# Patient Record
Sex: Female | Born: 1960 | Hispanic: No | Marital: Married | Smoking: Never smoker
Health system: Southern US, Community
[De-identification: ages and names within clinical notes are randomized; demographics above are authoritative.]

## PROBLEM LIST (undated history)

## (undated) DIAGNOSIS — E559 Vitamin D deficiency, unspecified: Secondary | ICD-10-CM

## (undated) DIAGNOSIS — E7800 Pure hypercholesterolemia, unspecified: Secondary | ICD-10-CM

## (undated) DIAGNOSIS — L989 Disorder of the skin and subcutaneous tissue, unspecified: Secondary | ICD-10-CM

## (undated) DIAGNOSIS — R7303 Prediabetes: Secondary | ICD-10-CM

## (undated) DIAGNOSIS — Z9884 Bariatric surgery status: Secondary | ICD-10-CM

## (undated) DIAGNOSIS — E538 Deficiency of other specified B group vitamins: Secondary | ICD-10-CM

## (undated) HISTORY — DX: Bariatric surgery status: Z98.84

## (undated) HISTORY — DX: Vitamin D deficiency, unspecified: E55.9

## (undated) HISTORY — PX: CHOLECYSTECTOMY: SHX55

## (undated) HISTORY — DX: Deficiency of other specified B group vitamins: E53.8

## (undated) HISTORY — DX: Prediabetes: R73.03

## (undated) HISTORY — DX: Disorder of the skin and subcutaneous tissue, unspecified: L98.9

## (undated) HISTORY — DX: Pure hypercholesterolemia, unspecified: E78.00

---

## 2002-03-29 ENCOUNTER — Encounter: Payer: Self-pay | Admitting: Gastroenterology

## 2002-03-29 ENCOUNTER — Encounter: Admission: RE | Admit: 2002-03-29 | Discharge: 2002-03-29 | Payer: Self-pay | Admitting: Gastroenterology

## 2012-12-09 HISTORY — PX: BARIATRIC SURGERY: SHX1103

## 2014-10-09 LAB — HM MAMMOGRAPHY

## 2015-02-01 ENCOUNTER — Encounter: Payer: Self-pay | Admitting: Internal Medicine

## 2015-02-01 ENCOUNTER — Ambulatory Visit (INDEPENDENT_AMBULATORY_CARE_PROVIDER_SITE_OTHER): Payer: BLUE CROSS/BLUE SHIELD | Admitting: Internal Medicine

## 2015-02-01 VITALS — BP 116/78 | HR 72 | Temp 98.4°F | Resp 20 | Ht 63.5 in | Wt 170.4 lb

## 2015-02-01 DIAGNOSIS — R350 Frequency of micturition: Secondary | ICD-10-CM | POA: Diagnosis not present

## 2015-02-01 DIAGNOSIS — R5383 Other fatigue: Secondary | ICD-10-CM

## 2015-02-01 DIAGNOSIS — F4321 Adjustment disorder with depressed mood: Secondary | ICD-10-CM

## 2015-02-01 DIAGNOSIS — Z1211 Encounter for screening for malignant neoplasm of colon: Secondary | ICD-10-CM | POA: Diagnosis not present

## 2015-02-01 DIAGNOSIS — R209 Unspecified disturbances of skin sensation: Secondary | ICD-10-CM | POA: Diagnosis not present

## 2015-02-01 DIAGNOSIS — Z9884 Bariatric surgery status: Secondary | ICD-10-CM

## 2015-02-01 DIAGNOSIS — IMO0001 Reserved for inherently not codable concepts without codable children: Secondary | ICD-10-CM

## 2015-02-01 DIAGNOSIS — L812 Freckles: Secondary | ICD-10-CM

## 2015-02-01 DIAGNOSIS — M542 Cervicalgia: Secondary | ICD-10-CM

## 2015-02-01 MED ORDER — ZOSTER VACCINE LIVE 19400 UNT/0.65ML ~~LOC~~ SOLR: 0.6500 mL | Freq: Once | SUBCUTANEOUS | Status: DC

## 2015-02-01 MED ORDER — THERA VITAL M PO TABS: 1.0000 | ORAL_TABLET | Freq: Every day | ORAL | Status: AC

## 2015-02-01 NOTE — Patient Instructions (Signed)
Take multivitamin daily  Follow up with chiropracter as scheduled  Will call with lab results  Will call with referrals for GI (colonoscopy) and dermatology  RTO in 1 month for CPE. Discuss Tetanus injection at next visit
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
## 2015-02-07 ENCOUNTER — Ambulatory Visit
Admission: RE | Admit: 2015-02-07 | Discharge: 2015-02-07 | Disposition: A | Discharge status: Home / Self Care | Payer: BLUE CROSS/BLUE SHIELD | Source: Ambulatory Visit | Attending: Internal Medicine | Admitting: Internal Medicine

## 2015-02-07 DIAGNOSIS — IMO0001 Reserved for inherently not codable concepts without codable children: Secondary | ICD-10-CM

## 2015-02-07 DIAGNOSIS — R209 Unspecified disturbances of skin sensation: Secondary | ICD-10-CM

## 2015-02-07 DIAGNOSIS — M542 Cervicalgia: Secondary | ICD-10-CM

## 2015-02-24 ENCOUNTER — Ambulatory Visit (INDEPENDENT_AMBULATORY_CARE_PROVIDER_SITE_OTHER): Payer: BLUE CROSS/BLUE SHIELD | Admitting: Internal Medicine

## 2015-02-24 ENCOUNTER — Encounter: Payer: Self-pay | Admitting: Internal Medicine

## 2015-02-24 VITALS — BP 120/82 | HR 70 | Temp 97.9°F | Resp 18 | Ht 63.0 in | Wt 168.2 lb

## 2015-02-24 DIAGNOSIS — J301 Allergic rhinitis due to pollen: Secondary | ICD-10-CM | POA: Diagnosis not present

## 2015-02-24 DIAGNOSIS — J209 Acute bronchitis, unspecified: Secondary | ICD-10-CM | POA: Diagnosis not present

## 2015-02-24 DIAGNOSIS — J0100 Acute maxillary sinusitis, unspecified: Secondary | ICD-10-CM

## 2015-02-24 MED ORDER — IPRATROPIUM-ALBUTEROL 0.5-2.5 (3) MG/3ML IN SOLN
3.0000 mL | Freq: Once | RESPIRATORY_TRACT | Status: AC
  Administered 2015-02-24: 3 mL via RESPIRATORY_TRACT

## 2015-02-24 MED ORDER — ALBUTEROL SULFATE HFA 108 (90 BASE) MCG/ACT IN AERS: 2.0000 | INHALATION_SPRAY | RESPIRATORY_TRACT | Status: DC | PRN

## 2015-02-24 MED ORDER — METHYLPREDNISOLONE (PAK) 4 MG PO TABS: ORAL_TABLET | ORAL | Status: DC

## 2015-02-24 MED ORDER — DOXYCYCLINE HYCLATE 100 MG PO TABS: 100.0000 mg | ORAL_TABLET | Freq: Two times a day (BID) | ORAL | Status: DC

## 2015-02-24 NOTE — Patient Instructions (Signed)
Push fluids and rest  Recommend OTC plain claritin, allegra or zyrtec daily for seasonal allergy  Use OTC saline nasal spray as needed to keep nose moist  Follow up as scheduled. Return to office if no better

## 2015-02-24 NOTE — Progress Notes (Signed)
Patient ID: Marie Swanson, female   DOB: May 14, 1961, 54 y.o.   MRN: [id number]    Facility  [facility]    Place of Service:   OFFICE   No Known Allergies  Chief Complaint  Patient presents with  . Acute Visit    Patient c/o cold systems: Cough, bilateral ear pain (right is worse),and headache.    HPI:  54 yo female seen today for URI sx's x several days. She c/o chest tightness, cough with production, fever at night (Tm 101F), HA, sore throat, fatigue, reduced appetite, ear itching. No Cp, SOB, dizziness, sleep disturbance, sinus pressure. Tried benadryl and Tylenol PM. No sick contacts. She is a nonsmoker.  History reviewed. No pertinent past medical history. Past Surgical History  Procedure Laterality Date  . Cholecystectomy    . Bariatric surgery  2014   History   Social History  . Marital Status: Married    Spouse Name: N/A  . Number of Children: N/A  . Years of Education: N/A   Social History Main Topics  . Smoking status: Never Smoker   . Smokeless tobacco: Not on file  . Alcohol Use: No  . Drug Use: No  . Sexual Activity: Not on file   Other Topics Concern  . None   Social History Narrative   Diet- No   Caffeine- No   Married- Yes, 1979   House- Yes, 2 story with 3 people   Pets- No   Current/Past professionBrewing technologist- Hospit[employer]   Exercise- No   Living Will- Yes   DNR- No   POA/HPOA-No           Medications: Patient's Medications  New Prescriptions   No medications on file  Previous Medications   MULTIPLE VITAMINS-MINERALS (MULTIVITAMIN) TABLET    Take 1 tablet by mouth daily.   ZOSTER VACCINE LIVE, PF, (ZOSTAVAX) [id number] UNT/0.65ML INJECTION    Inject 19,400 Units into the skin once.  Modified Medications   No medications on file  Discontinued Medications   No medications on file     Review of Systems  Constitutional: Positive for fever, appetite change and fatigue.  HENT: Positive for ear pain (and itching) and sore throat. Negative for facial  swelling and sinus pressure.   Respiratory: Positive for cough and chest tightness. Negative for shortness of breath.   Cardiovascular: Negative for chest pain.  Gastrointestinal: Negative for nausea.  Neurological: Positive for headaches.  Psychiatric/Behavioral: Negative for sleep disturbance.    Filed Vitals:   02/24/15 0936  BP: 120/82  Pulse: 70  Temp: 97.9 F (36.6 C)  TempSrc: Oral  Resp: 18  Height: 5\' 3"  (1.6 m)  Weight: 168 lb 3.2 oz (76.295 kg)  SpO2: 99%   Body mass index is 29.8 kg/(m^2).  Physical Exam  Constitutional: She appears well-developed and well-nourished.  Looks ill in NAD. Awake and alert  HENT:  Right Ear: Hearing, tympanic membrane, external ear and ear canal normal.  Left Ear: Hearing, tympanic membrane, external ear and ear canal normal.  Nose: Mucosal edema present. No rhinorrhea, sinus tenderness, nasal deformity or septal deviation. No epistaxis.  No foreign bodies. Right sinus exhibits no maxillary sinus tenderness and no frontal sinus tenderness. Left sinus exhibits maxillary sinus tenderness and frontal sinus tenderness.  Mouth/Throat: Uvula is midline and mucous membranes are normal. No oral lesions. Posterior oropharyngeal erythema present. No oropharyngeal exudate.    Eyes: Pupils are equal, round, and reactive to light. No scleral icterus.  Neck: Neck supple. No tracheal  deviation present.  Cardiovascular: Normal rate, regular rhythm, normal heart sounds and intact distal pulses.  Exam reveals no gallop and no friction rub.   No murmur heard. No LE edema b/l. no calf TTP.   Pulmonary/Chest: Effort normal. No stridor. No respiratory distress. She has wheezes (end expiratory). She has no rales.  Lymphadenopathy:    She has cervical adenopathy.  Skin: Skin is warm and dry. No rash noted.  Psychiatric: She has a normal mood and affect. Her behavior is normal. Judgment and thought content normal.     Labs reviewed: No results found for  any previous visit.   Assessment/Plan   ICD-9-CM ICD-10-CM   1. Acute bronchitis, unspecified organism probably related to #2 and #3 466.0 J20.9 methylPREDNIsolone (MEDROL DOSPACK) 4 MG tablet     albuterol (PROVENTIL HFA;VENTOLIN HFA) 108 (90 BASE) MCG/ACT inhaler     ipratropium-albuterol (DUONEB) 0.5-2.5 (3) MG/3ML nebulizer solution 3 mL  2. Allergic rhinitis due to pollen 477.0 J30.1 methylPREDNIsolone (MEDROL DOSPACK) 4 MG tablet  3. Acute maxillary sinusitis, recurrence not specified 461.0 J01.00 doxycycline (VIBRA-TABS) 100 MG tablet   --Push fluids and rest  --Recommend OTC plain claritin, allegra or zyrtec daily for seasonal allergy  --Use OTC saline nasal spray as needed to keep nose moist  --Follow up as scheduled. Return to office if no better   [facility] S. Ancil Linsey  [facility] [address] [place], [place] [address] [phone] Office (Wednesdays and Fridays 8 AM - 5 PM) [phone] Cell (Monday-Friday 8 AM - 5 PM)

## 2015-02-27 ENCOUNTER — Other Ambulatory Visit: Payer: BLUE CROSS/BLUE SHIELD

## 2015-02-27 DIAGNOSIS — Z9884 Bariatric surgery status: Secondary | ICD-10-CM

## 2015-02-27 DIAGNOSIS — R5383 Other fatigue: Secondary | ICD-10-CM

## 2015-02-27 DIAGNOSIS — F4321 Adjustment disorder with depressed mood: Secondary | ICD-10-CM

## 2015-02-28 LAB — URINALYSIS, ROUTINE W REFLEX MICROSCOPIC
Bilirubin, UA: NEGATIVE
Glucose, UA: NEGATIVE
Ketones, UA: NEGATIVE
Leukocytes, UA: NEGATIVE
Nitrite, UA: NEGATIVE
Protein, UA: NEGATIVE
RBC, UA: NEGATIVE
Specific Gravity, UA: 1.027 (ref 1.005–1.030)
UUROB: 0.2 mg/dL (ref 0.2–1.0)
pH, UA: 5.5 (ref 5.0–7.5)

## 2015-02-28 LAB — CBC WITH DIFFERENTIAL/PLATELET
BASOS: 1 %
Basophils Absolute: 0 10*3/uL (ref 0.0–0.2)
Eos: 0 %
Eosinophils Absolute: 0 10*3/uL (ref 0.0–0.4)
HEMATOCRIT: 37.5 % (ref 34.0–46.6)
HEMOGLOBIN: 12.4 g/dL (ref 11.1–15.9)
IMMATURE GRANS (ABS): 0 10*3/uL (ref 0.0–0.1)
Immature Granulocytes: 0 %
Lymphocytes Absolute: 1.8 10*3/uL (ref 0.7–3.1)
Lymphs: 32 %
MCH: 30 pg (ref 26.6–33.0)
MCHC: 33.1 g/dL (ref 31.5–35.7)
MCV: 91 fL (ref 79–97)
MONOS ABS: 0.4 10*3/uL (ref 0.1–0.9)
Monocytes: 6 %
NEUTROS ABS: 3.5 10*3/uL (ref 1.4–7.0)
Neutrophils Relative %: 61 %
Platelets: 261 10*3/uL (ref 150–379)
RBC: 4.14 x10E6/uL (ref 3.77–5.28)
RDW: 13.7 % (ref 12.3–15.4)
WBC: 5.6 10*3/uL (ref 3.4–10.8)

## 2015-02-28 LAB — COMPREHENSIVE METABOLIC PANEL
ALK PHOS: 85 IU/L (ref 39–117)
ALT: 17 IU/L (ref 0–32)
AST: 18 IU/L (ref 0–40)
Albumin/Globulin Ratio: 1.5 (ref 1.1–2.5)
Albumin: 3.9 g/dL (ref 3.5–5.5)
BILIRUBIN TOTAL: 0.7 mg/dL (ref 0.0–1.2)
BUN / CREAT RATIO: 25 — AB (ref 9–23)
BUN: 18 mg/dL (ref 6–24)
CALCIUM: 9.4 mg/dL (ref 8.7–10.2)
CHLORIDE: 103 mmol/L (ref 97–108)
CO2: 22 mmol/L (ref 18–29)
Creatinine, Ser: 0.73 mg/dL (ref 0.57–1.00)
GFR calc non Af Amer: 93 mL/min/{1.73_m2} (ref >59)
GFR, EST AFRICAN AMERICAN: 107 mL/min/{1.73_m2} (ref >59)
Globulin, Total: 2.6 g/dL (ref 1.5–4.5)
Glucose: 97 mg/dL (ref 65–99)
Potassium: 4.4 mmol/L (ref 3.5–5.2)
Sodium: 140 mmol/L (ref 134–144)
Total Protein: 6.5 g/dL (ref 6.0–8.5)

## 2015-02-28 LAB — TSH: TSH: 2.7 u[IU]/mL (ref 0.450–4.500)

## 2015-02-28 LAB — LIPID PANEL
CHOL/HDL RATIO: 3.6 ratio (ref 0.0–4.4)
Cholesterol, Total: 202 mg/dL — ABNORMAL HIGH (ref 100–199)
HDL: 56 mg/dL (ref >39)
LDL CALC: 132 mg/dL — AB (ref 0–99)
TRIGLYCERIDES: 70 mg/dL (ref 0–149)
VLDL Cholesterol Cal: 14 mg/dL (ref 5–40)

## 2015-02-28 LAB — VITAMIN B12: VITAMIN B 12: 879 pg/mL (ref 211–946)

## 2015-02-28 LAB — VITAMIN D 25 HYDROXY (VIT D DEFICIENCY, FRACTURES): Vit D, 25-Hydroxy: 19.7 ng/mL — ABNORMAL LOW (ref 30.0–100.0)

## 2015-03-01 ENCOUNTER — Ambulatory Visit (INDEPENDENT_AMBULATORY_CARE_PROVIDER_SITE_OTHER): Payer: BLUE CROSS/BLUE SHIELD | Admitting: Internal Medicine

## 2015-03-01 ENCOUNTER — Encounter: Payer: Self-pay | Admitting: Internal Medicine

## 2015-03-01 VITALS — BP 118/68 | HR 71 | Temp 97.8°F | Resp 18 | Ht 63.0 in | Wt 168.8 lb

## 2015-03-01 DIAGNOSIS — Z0000 Encounter for general adult medical examination without abnormal findings: Secondary | ICD-10-CM | POA: Diagnosis not present

## 2015-03-01 DIAGNOSIS — Z1211 Encounter for screening for malignant neoplasm of colon: Secondary | ICD-10-CM

## 2015-03-01 DIAGNOSIS — Z23 Encounter for immunization: Secondary | ICD-10-CM | POA: Diagnosis not present

## 2015-03-01 DIAGNOSIS — E559 Vitamin D deficiency, unspecified: Secondary | ICD-10-CM

## 2015-03-01 DIAGNOSIS — T8090XA Unspecified complication following infusion and therapeutic injection, initial encounter: Secondary | ICD-10-CM

## 2015-03-01 MED ORDER — VITAMIN D (ERGOCALCIFEROL) 1.25 MG (50000 UNIT) PO CAPS: 50000.0000 [IU] | ORAL_CAPSULE | ORAL | Status: DC

## 2015-03-01 NOTE — Patient Instructions (Addendum)
Once you complete prescription Vit D, start OTC Vitamin D3 2000 units daily.  Recommend benadryl every 6hrs and zyrtec  daily to reduce itching. Apply cool compress as needed to right arm injection site  Fat and Cholesterol Control Diet Fat and cholesterol levels in your blood and organs are influenced by your diet. High levels of fat and cholesterol may lead to diseases of the heart, small and large blood vessels, gallbladder, liver, and pancreas. CONTROLLING FAT AND CHOLESTEROL WITH DIET Although exercise and lifestyle factors are important, your diet is key. That is because certain foods are known to raise cholesterol and others to lower it. The goal is to balance foods for their effect on cholesterol and more importantly, to replace saturated and trans fat with other types of fat, such as monounsaturated fat, polyunsaturated fat, and omega-3 fatty acids. On average, a person should consume no more than 15 to 17 g of saturated fat daily. Saturated and trans fats are considered "bad" fats, and they will raise LDL cholesterol. Saturated fats are primarily found in animal products such as meats, butter, and cream. However, that does not mean you need to give up all your favorite foods. Today, there are good tasting, low-fat, low-cholesterol substitutes for most of the things you like to eat. Choose low-fat or nonfat alternatives. Choose round or loin cuts of red meat. These types of cuts are lowest in fat and cholesterol. Chicken (without the skin), fish, veal, and ground [place] breast are great choices. Eliminate fatty meats, such as hot dogs and salami. Even shellfish have little or no saturated fat. Have a 3 oz (85 g) portion when you eat lean meat, poultry, or fish. Trans fats are also called "partially hydrogenated oils." They are oils that have been scientifically manipulated so that they are solid at room temperature resulting in a longer shelf life and improved taste and texture of foods in  which they are added. Trans fats are found in stick margarine, some tub margarines, cookies, crackers, and baked goods.  When baking and cooking, oils are a great substitute for butter. The monounsaturated oils are especially beneficial since it is believed they lower LDL and raise HDL. The oils you should avoid entirely are saturated tropical oils, such as coconut and palm.  Remember to eat a lot from food groups that are naturally free of saturated and trans fat, including fish, fruit, vegetables, beans, grains (barley, rice, couscous, bulgur wheat), and pasta (without cream sauces).  IDENTIFYING FOODS THAT LOWER FAT AND CHOLESTEROL  Soluble fiber may lower your cholesterol. This type of fiber is found in fruits such as apples, vegetables such as broccoli, potatoes, and carrots, legumes such as beans, peas, and lentils, and grains such as barley. Foods fortified with plant sterols (phytosterol) may also lower cholesterol. You should eat at least 2 g per day of these foods for a cholesterol lowering effect.  Read package labels to identify low-saturated fats, trans fat free, and low-fat foods at the supermarket. Select cheeses that have only 2 to 3 g saturated fat per ounce. Use a heart-healthy tub margarine that is free of trans fats or partially hydrogenated oil. When buying baked goods (cookies, crackers), avoid partially hydrogenated oils. Breads and muffins should be made from whole grains (whole-wheat or whole oat flour, instead of "flour" or "enriched flour"). Buy non-creamy canned soups with reduced salt and no added fats.  FOOD PREPARATION TECHNIQUES  Never deep-fry. If you must fry, either stir-fry, which uses very little fat, or  use non-stick cooking sprays. When possible, broil, bake, or roast meats, and steam vegetables. Instead of putting butter or margarine on vegetables, use lemon and herbs, applesauce, and cinnamon (for squash and sweet potatoes). Use nonfat yogurt, salsa, and low-fat  dressings for salads.  LOW-SATURATED FAT / LOW-FAT FOOD SUBSTITUTES Meats / Saturated Fat (g)  Avoid: Steak, marbled (3 oz/85 g) / 11 g  Choose: Steak, lean (3 oz/85 g) / 4 g  Avoid: Hamburger (3 oz/85 g) / 7 g  Choose: Hamburger, lean (3 oz/85 g) / 5 g  Avoid: Ham (3 oz/85 g) / 6 g  Choose: Ham, lean cut (3 oz/85 g) / 2.4 g  Avoid: Chicken, with skin, dark meat (3 oz/85 g) / 4 g  Choose: Chicken, skin removed, dark meat (3 oz/85 g) / 2 g  Avoid: Chicken, with skin, light meat (3 oz/85 g) / 2.5 g  Choose: Chicken, skin removed, light meat (3 oz/85 g) / 1 g Dairy / Saturated Fat (g)  Avoid: Whole milk (1 cup) / 5 g  Choose: Low-fat milk, 2% (1 cup) / 3 g  Choose: Low-fat milk, 1% (1 cup) / 1.5 g  Choose: Skim milk (1 cup) / 0.3 g  Avoid: Hard cheese (1 oz/28 g) / 6 g  Choose: Skim milk cheese (1 oz/28 g) / 2 to 3 g  Avoid: Cottage cheese, 4% fat (1 cup) / 6.5 g  Choose: Low-fat cottage cheese, 1% fat (1 cup) / 1.5 g  Avoid: Ice cream (1 cup) / 9 g  Choose: Sherbet (1 cup) / 2.5 g  Choose: Nonfat frozen yogurt (1 cup) / 0.3 g  Choose: Frozen fruit bar / trace  Avoid: Whipped cream (1 tbs) / 3.5 g  Choose: Nondairy whipped topping (1 tbs) / 1 g Condiments / Saturated Fat (g)  Avoid: Mayonnaise (1 tbs) / 2 g  Choose: Low-fat mayonnaise (1 tbs) / 1 g  Avoid: Butter (1 tbs) / 7 g  Choose: Extra light margarine (1 tbs) / 1 g  Avoid: Coconut oil (1 tbs) / 11.8 g  Choose: Olive oil (1 tbs) / 1.8 g  Choose: Corn oil (1 tbs) / 1.7 g  Choose: Safflower oil (1 tbs) / 1.2 g  Choose: Sunflower oil (1 tbs) / 1.4 g  Choose: Soybean oil (1 tbs) / 2.4 g  Choose: Canola oil (1 tbs) / 1 g Document Released: 11/25/2005 Document Revised: 03/22/2013 Document Reviewed: 02/23/2014 ExitCare Patient Information 2015 [employer]. This information is not intended to replace advice given to you by your health care provider. Make sure you discuss any questions you have  with your health care provider.    Increase exercise 4-5 times per week at least 30-45 minutes each session

## 2015-03-01 NOTE — Progress Notes (Signed)
Patient ID: Marie Swanson, female   DOB: November 04, 1961, 54 y.o.   MRN: [id number]    Facility  [facility]    Place of Service:   OFFICE   No Known Allergies  Chief Complaint  Patient presents with  . Annual Exam    Yearly check up,discuss labs (copy printed) Patient states she had pap 12/2014     HPI:  54 yo female seen today for comprehensive exam. Bronchitis improved but now has HA and difficulty sleeping from prednisone. She has 1 more day. Cough improved. No CP, SOB, palpitations. No f/c.   Health maintenance reviewed. Needs colonoscopy as she has never had one. Prefers June appt as she is traveling to [place] this month and will return to [place] in May. Mammogram done 4-5 mos ago in [place]. Reportedly benign. Pap done in Jan 2016 at [facility] in [place]. She has a hx uterine fibroids and took medication to shrink them.  History reviewed. No pertinent past medical history. Past Surgical History  Procedure Laterality Date  . Cholecystectomy    . Bariatric surgery  2014   History   Social History  . Marital Status: Married    Spouse Name: N/A  . Number of Children: N/A  . Years of Education: N/A   Social History Main Topics  . Smoking status: Never Smoker   . Smokeless tobacco: Not on file  . Alcohol Use: No  . Drug Use: No  . Sexual Activity: Not on file   Other Topics Concern  . None   Social History Narrative   Diet- No   Caffeine- No   Married- Yes, 1979   House- Yes, 2 story with 3 people   Pets- No   Current/Past professionBrewing technologist   Exercise- No   Living Will- Yes   DNR- No   POA/HPOA-No         Family History  Problem Relation Age of Onset  . Diabetes Father   . Hypertension Father   . Heart attack Mother      Medications: Patient's Medications  New Prescriptions   No medications on file  Previous Medications   ALBUTEROL (PROVENTIL HFA;VENTOLIN HFA) 108 (90 BASE) MCG/ACT INHALER    Inhale 2 puffs into the lungs every 4 (four) hours as  needed for wheezing or shortness of breath (wheezing, difficulty breathing or cough).   DOXYCYCLINE (VIBRA-TABS) 100 MG TABLET    Take 1 tablet (100 mg total) by mouth 2 (two) times daily.   METHYLPREDNISOLONE (MEDROL DOSPACK) 4 MG TABLET    follow package directions   MULTIPLE VITAMINS-MINERALS (MULTIVITAMIN) TABLET    Take 1 tablet by mouth daily.   ZOSTER VACCINE LIVE, PF, (ZOSTAVAX) 40981 UNT/0.65ML INJECTION    Inject 19,400 Units into the skin once.  Modified Medications   No medications on file  Discontinued Medications   No medications on file     Review of Systems  Constitutional: Negative for fever, chills, diaphoresis, activity change, appetite change and fatigue.  HENT: Negative for ear pain and sore throat.   Eyes: Negative for visual disturbance.  Respiratory: Positive for cough. Negative for chest tightness and shortness of breath.   Cardiovascular: Negative for chest pain, palpitations and leg swelling.  Gastrointestinal: Negative for nausea, vomiting, abdominal pain, diarrhea, constipation and blood in stool.  Genitourinary: Negative for dysuria.  Musculoskeletal: Negative for arthralgias.  Skin: Positive for rash.  Neurological: Negative for dizziness, tremors, numbness and headaches.  Psychiatric/Behavioral: Negative for sleep disturbance. The patient is not  nervous/anxious.     Filed Vitals:   03/01/15 0952  BP: 118/68  Pulse: 71  Temp: 97.8 F (36.6 C)  TempSrc: Oral  Resp: 18  Height: 5\' 3"  (1.6 m)  Weight: 168 lb 12.8 oz (76.567 kg)  SpO2: 98%   Body mass index is 29.91 kg/(m^2).  Physical Exam  Constitutional: She is oriented to person, place, and time. She appears well-developed and well-nourished.  HENT:  Mouth/Throat: Oropharynx is clear and moist. No oropharyngeal exudate.  Eyes: Pupils are equal, round, and reactive to light. No scleral icterus.  Neck: Neck supple. No tracheal deviation present. No thyromegaly present.  Cardiovascular: Normal  rate, regular rhythm, normal heart sounds and intact distal pulses.  Exam reveals no gallop and no friction rub.   No murmur heard. No LE edema b/l. no calf TTP. No carotid bruit b/l  Pulmonary/Chest: Effort normal and breath sounds normal. No stridor. No respiratory distress. She has no wheezes. She has no rales.  Breast exam deferred. She had exam at GYN in Jan 2016  Abdominal: Soft. Bowel sounds are normal. She exhibits no distension and no mass. There is no tenderness. There is no rebound and no guarding.  Genitourinary:  Deferred exam- she had it performed by GYN inJan 2016  Rectal exam deferred to GI  Musculoskeletal: Normal range of motion. She exhibits no edema or tenderness.  Lymphadenopathy:    She has no cervical adenopathy.  Neurological: She is alert and oriented to person, place, and time. She has normal reflexes.  Skin: Skin is warm and dry. Rash (right arm injection site red and swollen. no urticaria) noted.     Psychiatric: She has a normal mood and affect. Her behavior is normal. Judgment and thought content normal.  Vitals reviewed.    Labs reviewed: Appointment on 02/27/2015  Component Date Value Ref Range Status  . Vit D, 25-Hydroxy 02/27/2015 19.7* 30.0 - 100.0 ng/mL Final   Comment: Vitamin D deficiency has been defined by the Institute of Medicine and an Endocrine Society practice guideline as a level of serum 25-OH vitamin D less than 20 ng/mL (1,2). The Endocrine Society went on to further define vitamin D insufficiency as a level between 21 and 29 ng/mL (2). 1. IOM (Institute of Medicine). 2010. Dietary reference    intakes for calcium and D. [place]: The    [employer] Academ[employer]. 2. Holick MF, [place], Bischoff-Ferrari HA, et al.    Evaluation, treatment, and prevention of vitamin D    deficiency: an Endocrine Society clinical practice    guideline. JCEM. 2011 Jul; 96(7):1911-30.   . Vitamin B-12 02/27/2015 879  211 - 946 pg/mL Final  .  Cholesterol, Total 02/27/2015 202* 100 - 199 mg/dL Final  . Triglycerides 02/27/2015 70  0 - 149 mg/dL Final  . HDL 04/54/098103/21/2016 56  >39 mg/dL Final   Comment: According to ATP-III Guidelines, HDL-C >59 mg/dL is considered a negative risk factor for CHD.   Marland Kitchen. VLDL Cholesterol Cal 02/27/2015 14  5 - 40 mg/dL Final  . LDL Calculated 02/27/2015 191132* 0 - 99 mg/dL Final  . Chol/HDL Ratio 02/27/2015 3.6  0.0 - 4.4 ratio units Final   Comment:                                   T. Chol/HDL Ratio  Men  Women                               1/2 Avg.Risk  3.4    3.3                                   Avg.Risk  5.0    4.4                                2X Avg.Risk  9.6    7.1                                3X Avg.Risk 23.4   11.0   . WBC 02/27/2015 5.6  3.4 - 10.8 x10E3/uL Final  . RBC 02/27/2015 4.14  3.77 - 5.28 x10E6/uL Final  . Hemoglobin 02/27/2015 12.4  11.1 - 15.9 g/dL Final  . HCT 47/82/9562 37.5  34.0 - 46.6 % Final  . MCV 02/27/2015 91  79 - 97 fL Final  . MCH 02/27/2015 30.0  26.6 - 33.0 pg Final  . MCHC 02/27/2015 33.1  31.5 - 35.7 g/dL Final  . RDW 13/07/6577 13.7  12.3 - 15.4 % Final  . Platelets 02/27/2015 261  150 - 379 x10E3/uL Final  . Neutrophils Relative % 02/27/2015 61   Final  . Lymphs 02/27/2015 32   Final  . Monocytes 02/27/2015 6   Final  . Eos 02/27/2015 0   Final  . Basos 02/27/2015 1   Final  . Neutrophils Absolute 02/27/2015 3.5  1.4 - 7.0 x10E3/uL Final  . Lymphocytes Absolute 02/27/2015 1.8  0.7 - 3.1 x10E3/uL Final  . Monocytes Absolute 02/27/2015 0.4  0.1 - 0.9 x10E3/uL Final  . Eosinophils Absolute 02/27/2015 0.0  0.0 - 0.4 x10E3/uL Final  . Basophils Absolute 02/27/2015 0.0  0.0 - 0.2 x10E3/uL Final  . Immature Granulocytes 02/27/2015 0   Final  . Immature Grans (Abs) 02/27/2015 0.0  0.0 - 0.1 x10E3/uL Final  . Glucose 02/27/2015 97  65 - 99 mg/dL Final  . BUN 46/96/2952 18  6 - 24 mg/dL Final  . Creatinine, Ser  02/27/2015 0.73  0.57 - 1.00 mg/dL Final  . GFR calc non Af Amer 02/27/2015 93  >59 mL/min/1.73 Final  . GFR calc Af Amer 02/27/2015 107  >59 mL/min/1.73 Final  . BUN/Creatinine Ratio 02/27/2015 25* 9 - 23 Final  . Sodium 02/27/2015 140  134 - 144 mmol/L Final  . Potassium 02/27/2015 4.4  3.5 - 5.2 mmol/L Final  . Chloride 02/27/2015 103  97 - 108 mmol/L Final  . CO2 02/27/2015 22  18 - 29 mmol/L Final  . Calcium 02/27/2015 9.4  8.7 - 10.2 mg/dL Final  . Total Protein 02/27/2015 6.5  6.0 - 8.5 g/dL Final  . Albumin 84/13/2440 3.9  3.5 - 5.5 g/dL Final  . Globulin, Total 02/27/2015 2.6  1.5 - 4.5 g/dL Final  . Albumin/Globulin Ratio 02/27/2015 1.5  1.1 - 2.5 Final  . Bilirubin Total 02/27/2015 0.7  0.0 - 1.2 mg/dL Final  . Alkaline Phosphatase 02/27/2015 85  39 - 117 IU/L Final  . AST 02/27/2015 18  0 - 40 IU/L Final  . ALT 02/27/2015 17  0 - 32 IU/L Final  .  TSH 02/27/2015 2.700  0.450 - 4.500 uIU/mL Final  . Specific Gravity, UA 02/27/2015 1.027  1.005 - 1.030 Final  . pH, UA 02/27/2015 5.5  5.0 - 7.5 Final  . Color, UA 02/27/2015 Yellow  Yellow Final  . Appearance Ur 02/27/2015 Cloudy* Clear Final  . Leukocytes, UA 02/27/2015 Negative  Negative Final  . Protein, UA 02/27/2015 Negative  Negative/Trace Final  . Glucose, UA 02/27/2015 Negative  Negative Final  . Ketones, UA 02/27/2015 Negative  Negative Final  . RBC, UA 02/27/2015 Negative  Negative Final  . Bilirubin, UA 02/27/2015 Negative  Negative Final  . Urobilinogen, Ur 02/27/2015 0.2  0.2 - 1.0 mg/dL Final  . Nitrite, UA 16/09/9603 Negative  Negative Final  . Microscopic Examination 02/27/2015 Comment   Final   Microscopic not indicated and not performed.   Labs reviewed with pt. She has Vit  D deficiency  Assessment/Plan   ICD-9-CM ICD-10-CM   1. Encounter for wellness examination V70.0 Z01.89   2. Need for Tdap vaccination V06.1 Z23 Tdap vaccine greater than or equal to 7yo IM  3. Vitamin D deficiency 268.9 E55.9  Vitamin D, Ergocalciferol, (DRISDOL) 50000 UNITS CAPS capsule  4. Encounter for screening colonoscopy V76.51 Z12.11 Ambulatory referral to Gastroenterology  5. Injection site reaction, initial encounter 999.9 T80.90XA     --Once you complete prescription Vit D, start OTC Vitamin D3 2000 units daily.  --Recommend benadryl every 6hrs and zyrtec  daily to reduce itching at injection site. Apply cool compress as needed to right arm injection site. Tdap vaccine added to allergy list  --Pt is UTD on health maintenance. Vaccinations are UTD. Encouraged her to maintain a healthy lifestyle. Exercise 30-45 minutes 4-5 times per week. Eat a well balanced diet. Avoid smoking. Limit alcohol intake. Wear seatbelt when riding in the car. Wear sun block (SPF >50) when spending extended times outside.  --f/u in 6 mos for f/u bariatric sx. She has lost a total of 50lbs since her sx in [place] about 1 yr ago   [place] S. Ancil Linsey  [facility] [address] [place], [place] [address] [phone] Office (Wednesdays and Fridays 8 AM - 5 PM) [phone] Cell (Monday-Friday 8 AM - 5 PM)

## 2015-03-13 ENCOUNTER — Encounter: Payer: Self-pay | Admitting: Gastroenterology

## 2015-07-05 ENCOUNTER — Encounter: Payer: Self-pay | Admitting: *Deleted

## 2015-07-06 ENCOUNTER — Encounter: Payer: Self-pay | Admitting: *Deleted

## 2015-09-14 LAB — HM MAMMOGRAPHY

## 2015-09-15 ENCOUNTER — Encounter: Payer: Self-pay | Admitting: *Deleted

## 2016-01-25 ENCOUNTER — Encounter: Payer: Self-pay | Admitting: Internal Medicine

## 2016-05-16 DIAGNOSIS — H40003 Preglaucoma, unspecified, bilateral: Secondary | ICD-10-CM | POA: Diagnosis not present

## 2016-06-12 DIAGNOSIS — H40003 Preglaucoma, unspecified, bilateral: Secondary | ICD-10-CM | POA: Diagnosis not present

## 2016-07-02 DIAGNOSIS — M545 Low back pain: Secondary | ICD-10-CM | POA: Diagnosis not present

## 2016-07-02 DIAGNOSIS — M25511 Pain in right shoulder: Secondary | ICD-10-CM | POA: Diagnosis not present

## 2016-07-16 DIAGNOSIS — M9903 Segmental and somatic dysfunction of lumbar region: Secondary | ICD-10-CM | POA: Diagnosis not present

## 2016-07-16 DIAGNOSIS — M791 Myalgia: Secondary | ICD-10-CM | POA: Diagnosis not present

## 2016-07-16 DIAGNOSIS — M9905 Segmental and somatic dysfunction of pelvic region: Secondary | ICD-10-CM | POA: Diagnosis not present

## 2016-07-16 DIAGNOSIS — M9902 Segmental and somatic dysfunction of thoracic region: Secondary | ICD-10-CM | POA: Diagnosis not present

## 2016-07-20 DIAGNOSIS — M9905 Segmental and somatic dysfunction of pelvic region: Secondary | ICD-10-CM | POA: Diagnosis not present

## 2016-07-20 DIAGNOSIS — M791 Myalgia: Secondary | ICD-10-CM | POA: Diagnosis not present

## 2016-07-20 DIAGNOSIS — M9902 Segmental and somatic dysfunction of thoracic region: Secondary | ICD-10-CM | POA: Diagnosis not present

## 2016-07-20 DIAGNOSIS — M9903 Segmental and somatic dysfunction of lumbar region: Secondary | ICD-10-CM | POA: Diagnosis not present

## 2016-07-22 DIAGNOSIS — M9902 Segmental and somatic dysfunction of thoracic region: Secondary | ICD-10-CM | POA: Diagnosis not present

## 2016-07-22 DIAGNOSIS — M9905 Segmental and somatic dysfunction of pelvic region: Secondary | ICD-10-CM | POA: Diagnosis not present

## 2016-07-22 DIAGNOSIS — M9903 Segmental and somatic dysfunction of lumbar region: Secondary | ICD-10-CM | POA: Diagnosis not present

## 2016-07-22 DIAGNOSIS — M791 Myalgia: Secondary | ICD-10-CM | POA: Diagnosis not present

## 2016-07-31 DIAGNOSIS — M791 Myalgia: Secondary | ICD-10-CM | POA: Diagnosis not present

## 2016-07-31 DIAGNOSIS — M9902 Segmental and somatic dysfunction of thoracic region: Secondary | ICD-10-CM | POA: Diagnosis not present

## 2016-07-31 DIAGNOSIS — M9905 Segmental and somatic dysfunction of pelvic region: Secondary | ICD-10-CM | POA: Diagnosis not present

## 2016-07-31 DIAGNOSIS — M9903 Segmental and somatic dysfunction of lumbar region: Secondary | ICD-10-CM | POA: Diagnosis not present

## 2016-08-02 DIAGNOSIS — H40003 Preglaucoma, unspecified, bilateral: Secondary | ICD-10-CM | POA: Diagnosis not present

## 2016-08-05 DIAGNOSIS — M791 Myalgia: Secondary | ICD-10-CM | POA: Diagnosis not present

## 2016-08-05 DIAGNOSIS — M9903 Segmental and somatic dysfunction of lumbar region: Secondary | ICD-10-CM | POA: Diagnosis not present

## 2016-08-05 DIAGNOSIS — M9902 Segmental and somatic dysfunction of thoracic region: Secondary | ICD-10-CM | POA: Diagnosis not present

## 2016-08-05 DIAGNOSIS — M9905 Segmental and somatic dysfunction of pelvic region: Secondary | ICD-10-CM | POA: Diagnosis not present

## 2016-08-09 DIAGNOSIS — M9905 Segmental and somatic dysfunction of pelvic region: Secondary | ICD-10-CM | POA: Diagnosis not present

## 2016-08-09 DIAGNOSIS — M9902 Segmental and somatic dysfunction of thoracic region: Secondary | ICD-10-CM | POA: Diagnosis not present

## 2016-08-09 DIAGNOSIS — M791 Myalgia: Secondary | ICD-10-CM | POA: Diagnosis not present

## 2016-08-09 DIAGNOSIS — M9903 Segmental and somatic dysfunction of lumbar region: Secondary | ICD-10-CM | POA: Diagnosis not present

## 2016-08-13 DIAGNOSIS — M791 Myalgia: Secondary | ICD-10-CM | POA: Diagnosis not present

## 2016-08-13 DIAGNOSIS — M9903 Segmental and somatic dysfunction of lumbar region: Secondary | ICD-10-CM | POA: Diagnosis not present

## 2016-08-13 DIAGNOSIS — M9902 Segmental and somatic dysfunction of thoracic region: Secondary | ICD-10-CM | POA: Diagnosis not present

## 2016-08-13 DIAGNOSIS — M9905 Segmental and somatic dysfunction of pelvic region: Secondary | ICD-10-CM | POA: Diagnosis not present

## 2016-08-20 DIAGNOSIS — Z1231 Encounter for screening mammogram for malignant neoplasm of breast: Secondary | ICD-10-CM | POA: Diagnosis not present

## 2016-08-21 DIAGNOSIS — M9902 Segmental and somatic dysfunction of thoracic region: Secondary | ICD-10-CM | POA: Diagnosis not present

## 2016-08-21 DIAGNOSIS — M9903 Segmental and somatic dysfunction of lumbar region: Secondary | ICD-10-CM | POA: Diagnosis not present

## 2016-08-21 DIAGNOSIS — M9905 Segmental and somatic dysfunction of pelvic region: Secondary | ICD-10-CM | POA: Diagnosis not present

## 2016-08-21 DIAGNOSIS — M791 Myalgia: Secondary | ICD-10-CM | POA: Diagnosis not present

## 2016-09-26 DIAGNOSIS — J019 Acute sinusitis, unspecified: Secondary | ICD-10-CM | POA: Diagnosis not present

## 2016-09-27 ENCOUNTER — Encounter: Payer: BLUE CROSS/BLUE SHIELD | Admitting: Internal Medicine

## 2017-01-06 DIAGNOSIS — R0602 Shortness of breath: Secondary | ICD-10-CM | POA: Diagnosis not present

## 2017-01-06 DIAGNOSIS — R5383 Other fatigue: Secondary | ICD-10-CM | POA: Diagnosis not present

## 2017-01-06 DIAGNOSIS — R0789 Other chest pain: Secondary | ICD-10-CM | POA: Diagnosis not present

## 2017-01-06 DIAGNOSIS — R5381 Other malaise: Secondary | ICD-10-CM | POA: Diagnosis not present

## 2017-01-08 DIAGNOSIS — R5381 Other malaise: Secondary | ICD-10-CM | POA: Diagnosis not present

## 2017-01-08 DIAGNOSIS — R0789 Other chest pain: Secondary | ICD-10-CM | POA: Diagnosis not present

## 2017-01-10 DIAGNOSIS — Z8249 Family history of ischemic heart disease and other diseases of the circulatory system: Secondary | ICD-10-CM | POA: Diagnosis not present

## 2017-01-10 DIAGNOSIS — R0789 Other chest pain: Secondary | ICD-10-CM | POA: Diagnosis not present

## 2017-01-10 DIAGNOSIS — R5381 Other malaise: Secondary | ICD-10-CM | POA: Diagnosis not present

## 2017-01-14 ENCOUNTER — Ambulatory Visit (INDEPENDENT_AMBULATORY_CARE_PROVIDER_SITE_OTHER): Payer: BLUE CROSS/BLUE SHIELD

## 2017-01-14 DIAGNOSIS — Z23 Encounter for immunization: Secondary | ICD-10-CM | POA: Diagnosis not present

## 2017-03-24 DIAGNOSIS — H40003 Preglaucoma, unspecified, bilateral: Secondary | ICD-10-CM | POA: Diagnosis not present

## 2017-03-26 DIAGNOSIS — Z6830 Body mass index (BMI) 30.0-30.9, adult: Secondary | ICD-10-CM | POA: Diagnosis not present

## 2017-03-26 DIAGNOSIS — Z01419 Encounter for gynecological examination (general) (routine) without abnormal findings: Secondary | ICD-10-CM | POA: Diagnosis not present

## 2017-04-28 DIAGNOSIS — M9907 Segmental and somatic dysfunction of upper extremity: Secondary | ICD-10-CM | POA: Diagnosis not present

## 2017-04-28 DIAGNOSIS — M9901 Segmental and somatic dysfunction of cervical region: Secondary | ICD-10-CM | POA: Diagnosis not present

## 2017-04-28 DIAGNOSIS — M9902 Segmental and somatic dysfunction of thoracic region: Secondary | ICD-10-CM | POA: Diagnosis not present

## 2017-04-28 DIAGNOSIS — M7542 Impingement syndrome of left shoulder: Secondary | ICD-10-CM | POA: Diagnosis not present

## 2017-05-06 DIAGNOSIS — M9902 Segmental and somatic dysfunction of thoracic region: Secondary | ICD-10-CM | POA: Diagnosis not present

## 2017-05-06 DIAGNOSIS — M9901 Segmental and somatic dysfunction of cervical region: Secondary | ICD-10-CM | POA: Diagnosis not present

## 2017-05-06 DIAGNOSIS — M7542 Impingement syndrome of left shoulder: Secondary | ICD-10-CM | POA: Diagnosis not present

## 2017-05-06 DIAGNOSIS — M9907 Segmental and somatic dysfunction of upper extremity: Secondary | ICD-10-CM | POA: Diagnosis not present

## 2017-07-02 ENCOUNTER — Encounter: Payer: Self-pay | Admitting: Internal Medicine

## 2017-07-02 ENCOUNTER — Ambulatory Visit (INDEPENDENT_AMBULATORY_CARE_PROVIDER_SITE_OTHER): Payer: BLUE CROSS/BLUE SHIELD | Admitting: Internal Medicine

## 2017-07-02 VITALS — BP 118/72 | HR 68 | Temp 97.8°F | Ht 63.0 in | Wt 180.2 lb

## 2017-07-02 DIAGNOSIS — Z9189 Other specified personal risk factors, not elsewhere classified: Secondary | ICD-10-CM | POA: Diagnosis not present

## 2017-07-02 DIAGNOSIS — Z0000 Encounter for general adult medical examination without abnormal findings: Secondary | ICD-10-CM | POA: Diagnosis not present

## 2017-07-02 DIAGNOSIS — G8929 Other chronic pain: Secondary | ICD-10-CM | POA: Diagnosis not present

## 2017-07-02 DIAGNOSIS — M542 Cervicalgia: Secondary | ICD-10-CM | POA: Diagnosis not present

## 2017-07-02 DIAGNOSIS — Z86018 Personal history of other benign neoplasm: Secondary | ICD-10-CM | POA: Diagnosis not present

## 2017-07-02 DIAGNOSIS — M25512 Pain in left shoulder: Secondary | ICD-10-CM

## 2017-07-02 DIAGNOSIS — Z1159 Encounter for screening for other viral diseases: Secondary | ICD-10-CM

## 2017-07-02 DIAGNOSIS — Z1211 Encounter for screening for malignant neoplasm of colon: Secondary | ICD-10-CM | POA: Diagnosis not present
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
## 2017-07-02 NOTE — Patient Instructions (Signed)
Will call with lab results  Will call with referral to GI for colonoscopy  Continue diet and exercise program  Check with your spouse regarding whether you snore while sleeping  Return to office in the [place] for fasting labs  Follow up in 1 yr for CPE. Fasting labs prior to appt  Keeping You Healthy  Get These Tests  Blood Pressure- Have your blood pressure checked by your healthcare provider at least once a year.  Normal blood pressure is 120/80.  Weight- Have your body mass index (BMI) calculated to screen for obesity.  BMI is a measure of body fat based on height and weight.  You can calculate your own BMI at [email or web address]://www.west-[email or web address]/www.[email or web address]/[email or web address]  Cholesterol- Have your cholesterol checked every year.  Diabetes- Have your blood sugar checked every year if you have high blood pressure, high cholesterol, a family history of diabetes or if you are overweight.  Pap Test - Have a pap test every 1 to 5 years if you have been sexually active.  If you are older than 65 and recent pap tests have been normal you may not need additional pap tests.  In addition, if you have had a hysterectomy  for benign disease additional pap tests are not necessary.  Mammogram-Yearly mammograms are essential for early detection of breast cancer  Screening for Colon Cancer- Colonoscopy starting at age 58. Screening may begin sooner depending on your family history and other health conditions.  Follow up colonoscopy as directed by your Gastroenterologist.  Screening for Osteoporosis- Screening begins at age 58 with bone density scanning, sooner if you are at higher risk for developing Osteoporosis.  Get these medicines  Calcium with Vitamin D- Your body requires 1200-1500 mg of Calcium a day and [phone] IU of Vitamin D a day.  You can only absorb 500 mg of Calcium at a time therefore Calcium must be taken in 2 or 3 separate doses throughout the day.  Hormones- Hormone therapy has been associated with increased risk for  certain cancers and heart disease.  Talk to your healthcare provider about if you need relief from menopausal symptoms.  Aspirin- Ask your healthcare provider about taking Aspirin to prevent Heart Disease and Stroke.  Get these Immuniztions  Flu shot- Every fall  Pneumonia shot- Once after the age of 58; if you are younger ask your healthcare provider if you need a pneumonia shot.  Tetanus- Every ten years.  Zostavax- Once after the age of 58 to prevent shingles.  Take these steps  Don't smoke- Your healthcare provider can help you quit. For tips on how to quit, ask your healthcare provider or go to www.smokefree.gov or call 1-800 QUIT-NOW.  Be physically active- Exercise 5 days a week for a minimum of 30 minutes.  If you are not already physically active, start slow and gradually work up to 30 minutes of moderate physical activity.  Try walking, dancing, bike riding, swimming, etc.  Eat a healthy diet- Eat a variety of healthy foods such as fruits, vegetables, whole grains, low fat milk, low fat cheeses, yogurt, lean meats, chicken, fish, eggs, dried beans, tofu, etc.  For more information go to www.thenutritionsource.org  Dental visit- Brush and floss teeth twice daily; visit your dentist twice a year.  Eye exam- Visit your Optometrist or Ophthalmologist yearly.  Drink alcohol in moderation- Limit alcohol intake to one drink or less a day.  Never drink and drive.  Depression- Your emotional health is as important as your physical health.  If  you're feeling down or losing interest in things you normally enjoy, please talk to your healthcare provider.  Seat Belts- can save your life; always wear one  Smoke/Carbon Monoxide detectors- These detectors need to be installed on the appropriate level of your home.  Replace batteries at least once a year.  Violence- If anyone is threatening or hurting you, please tell your healthcare provider.  Living Will/ Health care power of attorney-  Discuss with your healthcare provider and family.

## 2017-07-03 ENCOUNTER — Ambulatory Visit
Admission: RE | Admit: 2017-07-03 | Discharge: 2017-07-03 | Disposition: A | Discharge status: Home / Self Care | Payer: BLUE CROSS/BLUE SHIELD | Source: Ambulatory Visit | Attending: Internal Medicine | Admitting: Internal Medicine

## 2017-07-03 ENCOUNTER — Other Ambulatory Visit: Payer: BLUE CROSS/BLUE SHIELD

## 2017-07-03 DIAGNOSIS — G8929 Other chronic pain: Secondary | ICD-10-CM

## 2017-07-03 DIAGNOSIS — Z0000 Encounter for general adult medical examination without abnormal findings: Secondary | ICD-10-CM

## 2017-07-03 DIAGNOSIS — M25512 Pain in left shoulder: Secondary | ICD-10-CM

## 2017-07-03 DIAGNOSIS — Z1159 Encounter for screening for other viral diseases: Secondary | ICD-10-CM

## 2017-07-03 DIAGNOSIS — Z9189 Other specified personal risk factors, not elsewhere classified: Principal | ICD-10-CM

## 2017-07-03 DIAGNOSIS — M542 Cervicalgia: Secondary | ICD-10-CM | POA: Diagnosis not present

## 2017-07-03 DIAGNOSIS — M19012 Primary osteoarthritis, left shoulder: Secondary | ICD-10-CM | POA: Diagnosis not present

## 2017-07-03 LAB — COMPLETE METABOLIC PANEL WITH GFR
ALT: 15 U/L (ref 6–29)
AST: 19 U/L (ref 10–35)
Albumin: 3.9 g/dL (ref 3.6–5.1)
Alkaline Phosphatase: 74 U/L (ref 33–130)
BUN: 12 mg/dL (ref 7–25)
CALCIUM: 9.2 mg/dL (ref 8.6–10.4)
CHLORIDE: 106 mmol/L (ref 98–110)
CO2: 23 mmol/L (ref 20–31)
CREATININE: 0.94 mg/dL (ref 0.50–1.05)
GFR, Est African American: 77 mL/min (ref >=60)
GFR, Est Non African American: 67 mL/min (ref >=60)
Glucose, Bld: 99 mg/dL (ref 65–99)
POTASSIUM: 4.3 mmol/L (ref 3.5–5.3)
SODIUM: 140 mmol/L (ref 135–146)
Total Bilirubin: 0.7 mg/dL (ref 0.2–1.2)
Total Protein: 6.7 g/dL (ref 6.1–8.1)

## 2017-07-03 LAB — CBC WITH DIFFERENTIAL/PLATELET
BASOS ABS: 31 {cells}/uL (ref 0–200)
BASOS PCT: 1 %
EOS ABS: 124 {cells}/uL (ref 15–500)
EOS PCT: 4 %
HEMATOCRIT: 37.5 % (ref 35.0–45.0)
Hemoglobin: 12.3 g/dL (ref 11.7–15.5)
LYMPHS ABS: 1271 {cells}/uL (ref 850–3900)
Lymphocytes Relative: 41 %
MCH: 29.4 pg (ref 27.0–33.0)
MCHC: 32.8 g/dL (ref 32.0–36.0)
MCV: 89.7 fL (ref 80.0–100.0)
MPV: 11 fL (ref 7.5–12.5)
Monocytes Absolute: 217 cells/uL (ref 200–950)
Monocytes Relative: 7 %
NEUTROS PCT: 47 %
Neutro Abs: 1457 cells/uL — ABNORMAL LOW (ref 1500–7800)
PLATELETS: 214 10*3/uL (ref 140–400)
RBC: 4.18 MIL/uL (ref 3.80–5.10)
RDW: 14.3 % (ref 11.0–15.0)
WBC: 3.1 10*3/uL — ABNORMAL LOW (ref 3.8–10.8)

## 2017-07-03 LAB — LIPID PANEL
CHOL/HDL RATIO: 3.9 ratio (ref <5.0)
CHOLESTEROL: 212 mg/dL — AB (ref <200)
HDL: 54 mg/dL (ref >50)
LDL CALC: 139 mg/dL — AB (ref <100)
Triglycerides: 97 mg/dL (ref <150)
VLDL: 19 mg/dL (ref <30)

## 2017-07-03 LAB — TSH: TSH: 1.75 mIU/L

## 2017-07-03 LAB — HEPATITIS C ANTIBODY: HCV AB: NEGATIVE

## 2017-07-04 ENCOUNTER — Other Ambulatory Visit: Payer: Self-pay

## 2017-07-04 DIAGNOSIS — D729 Disorder of white blood cells, unspecified: Secondary | ICD-10-CM

## 2017-07-04 DIAGNOSIS — M19019 Primary osteoarthritis, unspecified shoulder: Secondary | ICD-10-CM

## 2017-07-04 DIAGNOSIS — M47812 Spondylosis without myelopathy or radiculopathy, cervical region: Secondary | ICD-10-CM

## 2017-07-04 LAB — URINALYSIS, ROUTINE W REFLEX MICROSCOPIC
BILIRUBIN URINE: NEGATIVE
Glucose, UA: NEGATIVE
HGB URINE DIPSTICK: NEGATIVE
KETONES UR: NEGATIVE
Leukocytes, UA: NEGATIVE
NITRITE: NEGATIVE
PH: 7 (ref 5.0–8.0)
Protein, ur: NEGATIVE
SPECIFIC GRAVITY, URINE: 1.026 (ref 1.001–1.035)

## 2017-07-15 ENCOUNTER — Encounter (INDEPENDENT_AMBULATORY_CARE_PROVIDER_SITE_OTHER): Payer: Self-pay | Admitting: Orthopaedic Surgery

## 2017-07-15 ENCOUNTER — Ambulatory Visit (INDEPENDENT_AMBULATORY_CARE_PROVIDER_SITE_OTHER): Payer: BLUE CROSS/BLUE SHIELD | Admitting: Orthopaedic Surgery

## 2017-07-15 DIAGNOSIS — M5412 Radiculopathy, cervical region: Secondary | ICD-10-CM | POA: Diagnosis not present

## 2017-07-15 MED ORDER — PREDNISONE 10 MG (21) PO TBPK: ORAL_TABLET | ORAL | 0 refills | Status: DC

## 2017-07-15 MED ORDER — DICLOFENAC SODIUM 75 MG PO TBEC: 75.0000 mg | DELAYED_RELEASE_TABLET | Freq: Two times a day (BID) | ORAL | 2 refills | Status: DC

## 2017-07-15 MED ORDER — METHOCARBAMOL 500 MG PO TABS: 500.0000 mg | ORAL_TABLET | Freq: Four times a day (QID) | ORAL | 2 refills | Status: DC | PRN

## 2017-07-15 NOTE — Progress Notes (Signed)
Office Visit Note   Patient: Marie Swanson           Date of Birth: 03-12-1961           MRN: [id number] Visit Date: 07/15/2017              Requested by: Kirt Boys, DO [address] [place], [place] [address] PCP: Kirt Boys, DO   Assessment & Plan: Visit Diagnoses:  1. Cervical radiculopathy     Plan: Overall impression is cervical radiculopathy. 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 We will treat her with prednisone, Robaxin, diclofenac as needed as a first line. If not better and consider MRI of her neck versus glenohumeral injection  Follow-Up Instructions: Return if symptoms worsen or fail to improve.   Orders:  No orders of the defined types were placed in this encounter.  Meds ordered this encounter  Medications  . predniSONE (STERAPRED UNI-PAK 21 TAB) 10 MG (21) TBPK tablet    Sig: Take as directed    Dispense:  21 tablet    Refill:  0  . methocarbamol (ROBAXIN) 500 MG tablet    Sig: Take 1 tablet (500 mg total) by mouth every 6 (six) hours as needed for muscle spasms.    Dispense:  30 tablet    Refill:  2  . diclofenac (VOLTAREN) 75 MG EC tablet    Sig: Take 1 tablet (75 mg total) by mouth 2 (two) times daily.    Dispense:  30 tablet    Refill:  2      Procedures: No procedures performed   Clinical Data: No additional findings.   Subjective: Chief Complaint  Patient presents with  . Left Shoulder - Pain    Patient is a 56 year old female with 2 months history of left shoulder pain with radiation down into the forearm. She does endorse numbness and moderate pain that comes and goes. She is taking Tylenol without any significant relief. Denies any injuries. Denies any paralysis.    Review of Systems  Constitutional: Negative.   HENT: Negative.   Eyes: Negative.   Respiratory: Negative.   Cardiovascular: Negative.   Endocrine: Negative.   Musculoskeletal: Negative.     Neurological: Negative.   Hematological: Negative.   Psychiatric/Behavioral: Negative.   All other systems reviewed and are negative.    Objective: Vital Signs: There were no vitals taken for this visit.  Physical Exam  Constitutional: She is oriented to person, place, and time. She appears well-developed and well-nourished.  HENT:  Head: Normocephalic and atraumatic.  Eyes: EOM are normal.  Neck: Neck supple.  Pulmonary/Chest: Effort normal.  Abdominal: Soft.  Neurological: She is alert and oriented to person, place, and time.  Skin: Skin is warm. Capillary refill takes less than 2 seconds.  Psychiatric: She has a normal mood and affect. Her behavior is normal. Judgment and thought content normal.  Nursing note and vitals reviewed.   Ortho Exam Shoulder and cervical neck exam show painless range of motion of the shoulder with normal motor and sensory function. Mildly positive Spurling's. Negative shoulder adduction sign. Specialty Comments:  No specialty comments available.  Imaging: No results found.   PMFS History: Patient Active Problem List   Diagnosis Date Noted  . Neck pain of over 3 months duration 07/02/2017  . History of uterine fibroid 07/02/2017   No past medical history on file.  Family History  Problem Relation Age of Onset  .  Diabetes Father   . Hypertension Father   . Heart attack Mother     Past Surgical History:  Procedure Laterality Date  . BARIATRIC SURGERY  2014  . CHOLECYSTECTOMY     Social History   Occupational History  . Not on file.   Social History Main Topics  . Smoking status: Never Smoker  . Smokeless tobacco: Never Used  . Alcohol use No  . Drug use: No  . Sexual activity: Not on file

## 2017-07-24 ENCOUNTER — Encounter: Payer: Self-pay | Admitting: Gastroenterology

## 2017-08-06 ENCOUNTER — Other Ambulatory Visit: Payer: BLUE CROSS/BLUE SHIELD

## 2017-08-07 ENCOUNTER — Other Ambulatory Visit: Payer: BLUE CROSS/BLUE SHIELD

## 2017-08-07 DIAGNOSIS — D729 Disorder of white blood cells, unspecified: Secondary | ICD-10-CM | POA: Diagnosis not present

## 2017-08-07 LAB — CBC WITH DIFFERENTIAL/PLATELET
BASOS ABS: 34 {cells}/uL (ref 0–200)
Basophils Relative: 1 %
EOS ABS: 102 {cells}/uL (ref 15–500)
Eosinophils Relative: 3 %
HEMATOCRIT: 38.7 % (ref 35.0–45.0)
Hemoglobin: 12.6 g/dL (ref 11.7–15.5)
Lymphocytes Relative: 41 %
Lymphs Abs: 1394 cells/uL (ref 850–3900)
MCH: 29.2 pg (ref 27.0–33.0)
MCHC: 32.6 g/dL (ref 32.0–36.0)
MCV: 89.8 fL (ref 80.0–100.0)
MONOS PCT: 10 %
MPV: 11 fL (ref 7.5–12.5)
Monocytes Absolute: 340 cells/uL (ref 200–950)
NEUTROS ABS: 1530 {cells}/uL (ref 1500–7800)
Neutrophils Relative %: 45 %
PLATELETS: 220 10*3/uL (ref 140–400)
RBC: 4.31 MIL/uL (ref 3.80–5.10)
RDW: 14.5 % (ref 11.0–15.0)
WBC: 3.4 10*3/uL — ABNORMAL LOW (ref 3.8–10.8)

## 2017-08-20 DIAGNOSIS — H40003 Preglaucoma, unspecified, bilateral: Secondary | ICD-10-CM | POA: Diagnosis not present

## 2017-08-25 DIAGNOSIS — Z1231 Encounter for screening mammogram for malignant neoplasm of breast: Secondary | ICD-10-CM | POA: Diagnosis not present

## 2017-09-03 DIAGNOSIS — M79602 Pain in left arm: Secondary | ICD-10-CM | POA: Diagnosis not present

## 2017-09-03 DIAGNOSIS — M25512 Pain in left shoulder: Secondary | ICD-10-CM | POA: Diagnosis not present

## 2017-09-17 DIAGNOSIS — M79602 Pain in left arm: Secondary | ICD-10-CM | POA: Diagnosis not present

## 2017-09-17 DIAGNOSIS — M25512 Pain in left shoulder: Secondary | ICD-10-CM | POA: Diagnosis not present

## 2017-09-23 ENCOUNTER — Ambulatory Visit: Payer: BLUE CROSS/BLUE SHIELD | Admitting: Gastroenterology

## 2017-10-24 ENCOUNTER — Encounter: Payer: Self-pay | Admitting: Internal Medicine

## 2017-10-28 ENCOUNTER — Ambulatory Visit: Payer: BLUE CROSS/BLUE SHIELD | Admitting: Nurse Practitioner

## 2018-02-09 IMAGING — CR DG SHOULDER 2+V*L*
3 series · 3 of 3 positions shown · non-contrast
Comparison: No prior .

CLINICAL DATA: Left shoulder pain.  No injury.

EXAM:
LEFT SHOULDER - 2+ VIEW

[w shoulder grashey left]
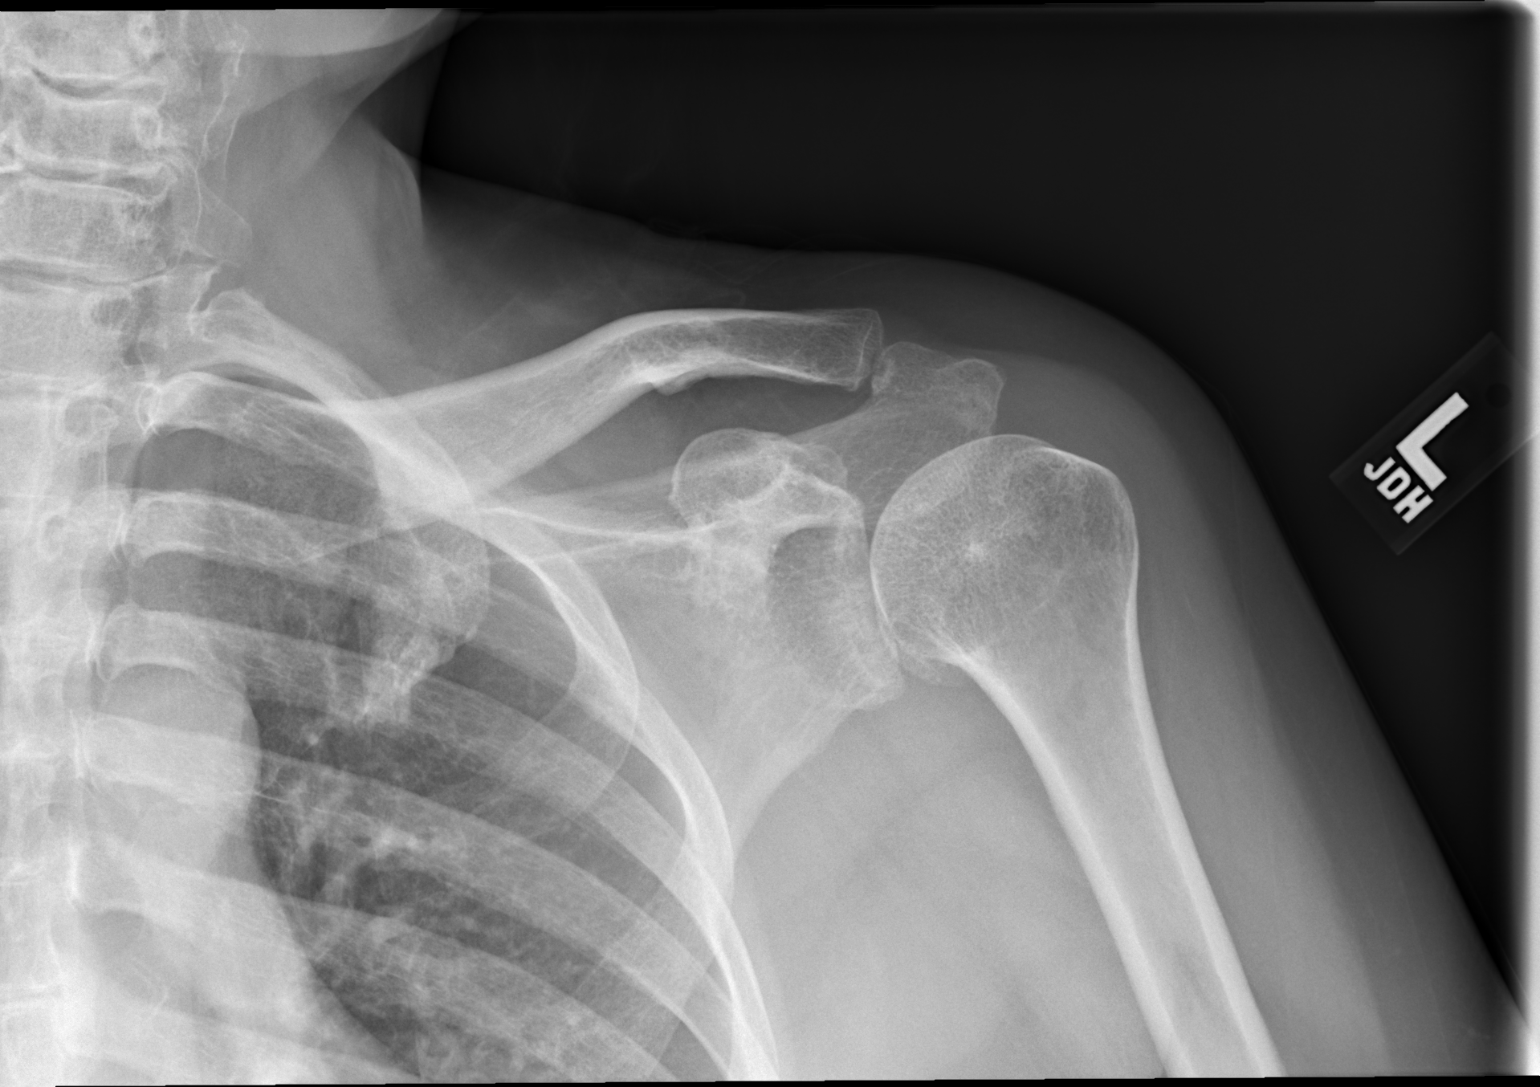

[w shoulder y-view left]
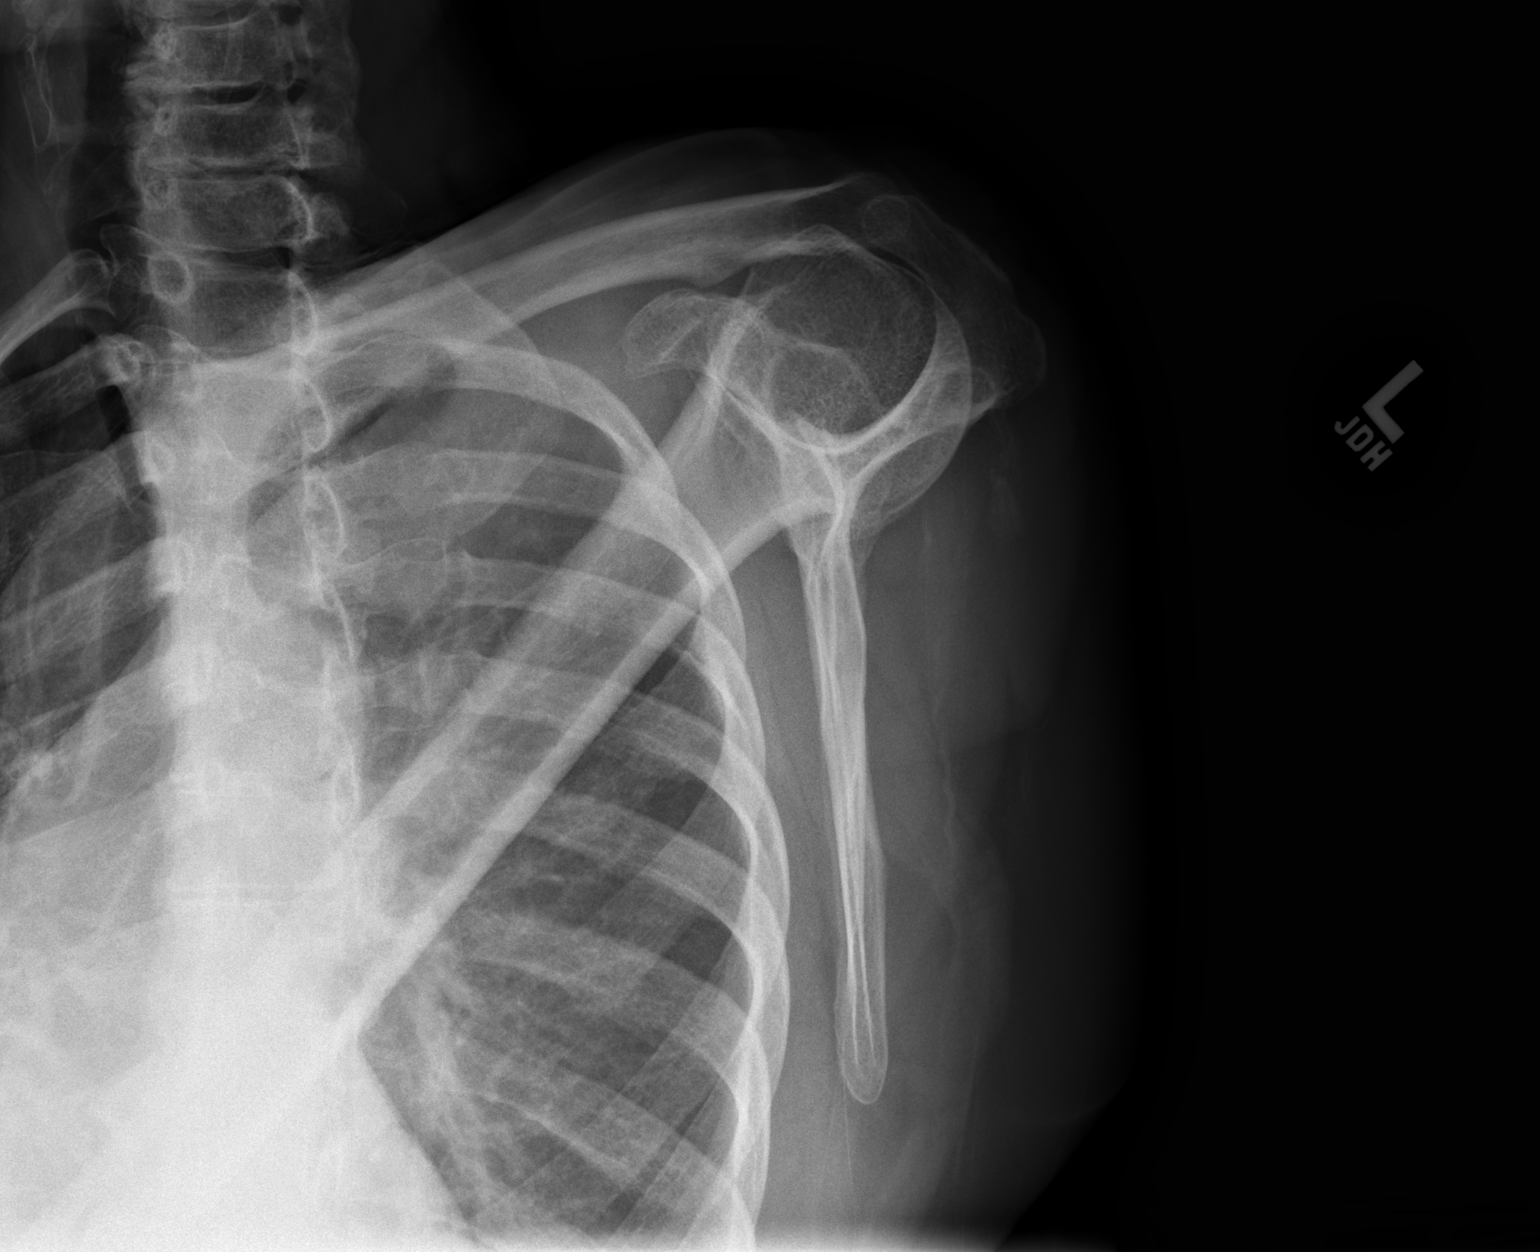

[w shoulder axillary left]
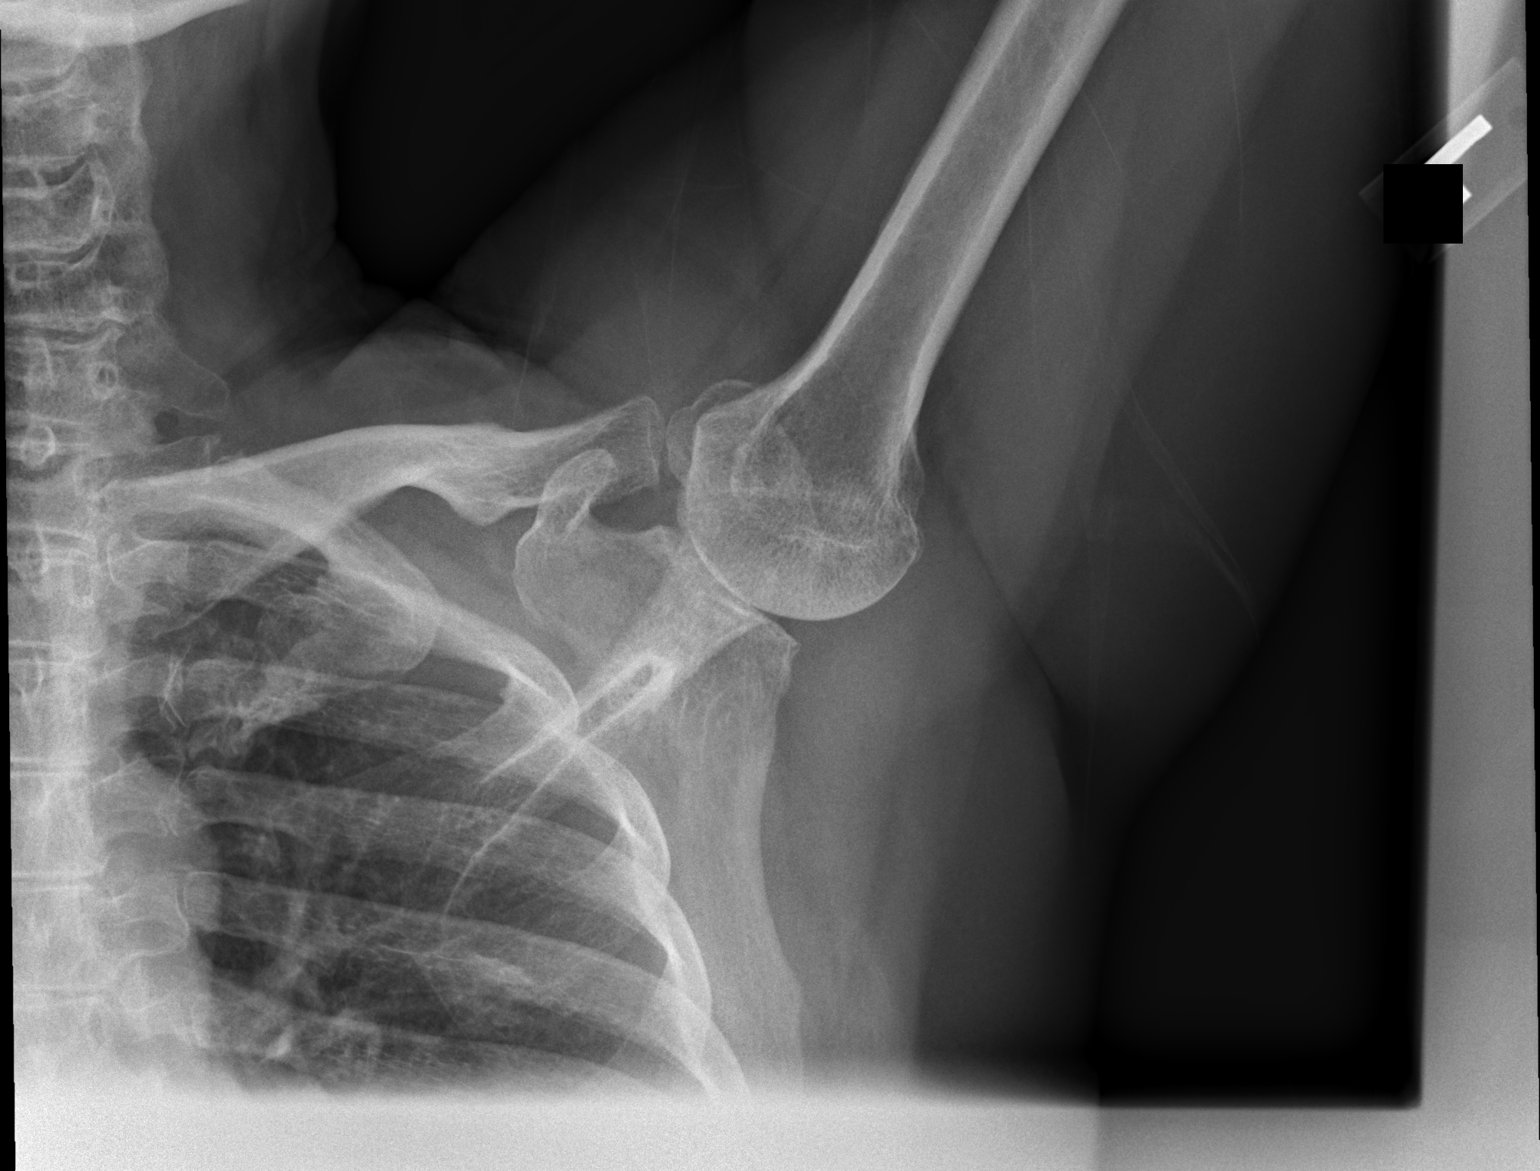

[3 of 3 positions shown; findings below may reference images not displayed]

FINDINGS: No acute bony or joint abnormality. No evidence of fracture or
dislocation. Acromioclavicular and glenohumeral degenerative change.
IMPRESSION: No acute bony or joint abnormality. Degenerative changes left
shoulder.

## 2018-05-27 DIAGNOSIS — M25561 Pain in right knee: Secondary | ICD-10-CM | POA: Diagnosis not present

## 2018-05-27 DIAGNOSIS — M2550 Pain in unspecified joint: Secondary | ICD-10-CM | POA: Diagnosis not present

## 2018-05-27 DIAGNOSIS — M542 Cervicalgia: Secondary | ICD-10-CM | POA: Diagnosis not present

## 2018-05-27 DIAGNOSIS — R202 Paresthesia of skin: Secondary | ICD-10-CM | POA: Diagnosis not present

## 2018-05-27 DIAGNOSIS — M1712 Unilateral primary osteoarthritis, left knee: Secondary | ICD-10-CM | POA: Diagnosis not present

## 2018-05-27 DIAGNOSIS — M25562 Pain in left knee: Secondary | ICD-10-CM | POA: Diagnosis not present

## 2018-05-27 DIAGNOSIS — R5383 Other fatigue: Secondary | ICD-10-CM | POA: Diagnosis not present

## 2018-05-27 DIAGNOSIS — M19012 Primary osteoarthritis, left shoulder: Secondary | ICD-10-CM | POA: Diagnosis not present

## 2018-05-27 DIAGNOSIS — M1711 Unilateral primary osteoarthritis, right knee: Secondary | ICD-10-CM | POA: Diagnosis not present

## 2018-05-29 DIAGNOSIS — R7309 Other abnormal glucose: Secondary | ICD-10-CM | POA: Diagnosis not present

## 2018-05-29 DIAGNOSIS — Z13220 Encounter for screening for lipoid disorders: Secondary | ICD-10-CM | POA: Diagnosis not present

## 2018-05-29 DIAGNOSIS — R5383 Other fatigue: Secondary | ICD-10-CM | POA: Diagnosis not present

## 2018-05-29 DIAGNOSIS — M2550 Pain in unspecified joint: Secondary | ICD-10-CM | POA: Diagnosis not present

## 2018-06-02 DIAGNOSIS — Z0000 Encounter for general adult medical examination without abnormal findings: Secondary | ICD-10-CM | POA: Diagnosis not present

## 2018-06-02 DIAGNOSIS — M150 Primary generalized (osteo)arthritis: Secondary | ICD-10-CM | POA: Diagnosis not present

## 2018-06-02 DIAGNOSIS — Z6831 Body mass index (BMI) 31.0-31.9, adult: Secondary | ICD-10-CM | POA: Diagnosis not present

## 2018-06-09 DIAGNOSIS — Z01419 Encounter for gynecological examination (general) (routine) without abnormal findings: Secondary | ICD-10-CM | POA: Diagnosis not present

## 2018-06-09 DIAGNOSIS — Z6831 Body mass index (BMI) 31.0-31.9, adult: Secondary | ICD-10-CM | POA: Diagnosis not present

## 2018-06-18 ENCOUNTER — Other Ambulatory Visit: Payer: Self-pay | Admitting: *Deleted

## 2018-06-18 DIAGNOSIS — Z0000 Encounter for general adult medical examination without abnormal findings: Secondary | ICD-10-CM

## 2018-07-03 ENCOUNTER — Other Ambulatory Visit: Payer: BLUE CROSS/BLUE SHIELD

## 2018-07-07 ENCOUNTER — Encounter: Payer: BLUE CROSS/BLUE SHIELD | Admitting: Internal Medicine

## 2018-09-25 DIAGNOSIS — Z1231 Encounter for screening mammogram for malignant neoplasm of breast: Secondary | ICD-10-CM | POA: Diagnosis not present

## 2018-10-07 DIAGNOSIS — R928 Other abnormal and inconclusive findings on diagnostic imaging of breast: Secondary | ICD-10-CM | POA: Diagnosis not present

## 2018-10-07 DIAGNOSIS — R922 Inconclusive mammogram: Secondary | ICD-10-CM | POA: Diagnosis not present

## 2018-10-12 DIAGNOSIS — M25511 Pain in right shoulder: Secondary | ICD-10-CM | POA: Diagnosis not present

## 2018-10-12 DIAGNOSIS — M545 Low back pain: Secondary | ICD-10-CM | POA: Diagnosis not present

## 2018-10-20 DIAGNOSIS — R7309 Other abnormal glucose: Secondary | ICD-10-CM | POA: Diagnosis not present

## 2018-10-20 DIAGNOSIS — R5383 Other fatigue: Secondary | ICD-10-CM | POA: Diagnosis not present

## 2018-10-27 DIAGNOSIS — M1990 Unspecified osteoarthritis, unspecified site: Secondary | ICD-10-CM | POA: Diagnosis not present

## 2018-10-27 DIAGNOSIS — M5030 Other cervical disc degeneration, unspecified cervical region: Secondary | ICD-10-CM | POA: Diagnosis not present

## 2018-10-27 DIAGNOSIS — D649 Anemia, unspecified: Secondary | ICD-10-CM | POA: Diagnosis not present

## 2018-11-13 ENCOUNTER — Encounter: Payer: BLUE CROSS/BLUE SHIELD | Admitting: Physical Medicine & Rehabilitation

## 2018-12-11 DIAGNOSIS — E860 Dehydration: Secondary | ICD-10-CM | POA: Diagnosis not present

## 2018-12-11 DIAGNOSIS — R808 Other proteinuria: Secondary | ICD-10-CM | POA: Diagnosis not present

## 2018-12-11 DIAGNOSIS — R197 Diarrhea, unspecified: Secondary | ICD-10-CM | POA: Diagnosis not present

## 2018-12-14 DIAGNOSIS — R34 Anuria and oliguria: Secondary | ICD-10-CM | POA: Diagnosis not present

## 2018-12-14 DIAGNOSIS — R197 Diarrhea, unspecified: Secondary | ICD-10-CM | POA: Diagnosis not present

## 2019-04-20 DIAGNOSIS — R7309 Other abnormal glucose: Secondary | ICD-10-CM | POA: Diagnosis not present

## 2019-04-20 DIAGNOSIS — D649 Anemia, unspecified: Secondary | ICD-10-CM | POA: Diagnosis not present

## 2019-04-21 DIAGNOSIS — M1990 Unspecified osteoarthritis, unspecified site: Secondary | ICD-10-CM | POA: Diagnosis not present

## 2019-04-21 DIAGNOSIS — M5030 Other cervical disc degeneration, unspecified cervical region: Secondary | ICD-10-CM | POA: Diagnosis not present

## 2019-04-21 DIAGNOSIS — R7309 Other abnormal glucose: Secondary | ICD-10-CM | POA: Diagnosis not present

## 2019-04-21 DIAGNOSIS — L739 Follicular disorder, unspecified: Secondary | ICD-10-CM | POA: Diagnosis not present

## 2019-08-30 DIAGNOSIS — Z6833 Body mass index (BMI) 33.0-33.9, adult: Secondary | ICD-10-CM | POA: Diagnosis not present

## 2019-08-30 DIAGNOSIS — Z1231 Encounter for screening mammogram for malignant neoplasm of breast: Secondary | ICD-10-CM | POA: Diagnosis not present

## 2019-08-30 DIAGNOSIS — Z01419 Encounter for gynecological examination (general) (routine) without abnormal findings: Secondary | ICD-10-CM | POA: Diagnosis not present

## 2019-11-13 DIAGNOSIS — Z20828 Contact with and (suspected) exposure to other viral communicable diseases: Secondary | ICD-10-CM | POA: Diagnosis not present

## 2019-11-13 DIAGNOSIS — J018 Other acute sinusitis: Secondary | ICD-10-CM | POA: Diagnosis not present

## 2019-12-01 ENCOUNTER — Ambulatory Visit: Payer: BC Managed Care – PPO | Attending: Internal Medicine

## 2019-12-01 DIAGNOSIS — Z20828 Contact with and (suspected) exposure to other viral communicable diseases: Secondary | ICD-10-CM | POA: Diagnosis not present

## 2019-12-01 DIAGNOSIS — Z20822 Contact with and (suspected) exposure to covid-19: Secondary | ICD-10-CM

## 2019-12-02 LAB — NOVEL CORONAVIRUS, NAA: SARS-CoV-2, NAA: NOT DETECTED

## 2019-12-15 DIAGNOSIS — H40003 Preglaucoma, unspecified, bilateral: Secondary | ICD-10-CM | POA: Diagnosis not present

## 2020-01-13 DIAGNOSIS — H40003 Preglaucoma, unspecified, bilateral: Secondary | ICD-10-CM | POA: Diagnosis not present

## 2020-04-28 DIAGNOSIS — R7303 Prediabetes: Secondary | ICD-10-CM | POA: Diagnosis not present

## 2020-05-23 DIAGNOSIS — E7800 Pure hypercholesterolemia, unspecified: Secondary | ICD-10-CM | POA: Diagnosis not present

## 2020-05-23 DIAGNOSIS — Z1211 Encounter for screening for malignant neoplasm of colon: Secondary | ICD-10-CM | POA: Diagnosis not present

## 2020-05-23 DIAGNOSIS — R7303 Prediabetes: Secondary | ICD-10-CM | POA: Diagnosis not present

## 2020-08-07 DIAGNOSIS — Z20822 Contact with and (suspected) exposure to covid-19: Secondary | ICD-10-CM | POA: Diagnosis not present

## 2020-11-07 DIAGNOSIS — Z1231 Encounter for screening mammogram for malignant neoplasm of breast: Secondary | ICD-10-CM | POA: Diagnosis not present

## 2020-11-15 DIAGNOSIS — R635 Abnormal weight gain: Secondary | ICD-10-CM | POA: Diagnosis not present

## 2020-11-15 DIAGNOSIS — E7800 Pure hypercholesterolemia, unspecified: Secondary | ICD-10-CM | POA: Diagnosis not present

## 2020-11-15 DIAGNOSIS — R7303 Prediabetes: Secondary | ICD-10-CM | POA: Diagnosis not present

## 2020-11-27 DIAGNOSIS — E538 Deficiency of other specified B group vitamins: Secondary | ICD-10-CM | POA: Diagnosis not present

## 2020-11-27 DIAGNOSIS — E559 Vitamin D deficiency, unspecified: Secondary | ICD-10-CM | POA: Diagnosis not present

## 2020-11-27 DIAGNOSIS — R7303 Prediabetes: Secondary | ICD-10-CM | POA: Diagnosis not present

## 2020-11-27 DIAGNOSIS — Z23 Encounter for immunization: Secondary | ICD-10-CM | POA: Diagnosis not present

## 2020-11-27 DIAGNOSIS — Z0000 Encounter for general adult medical examination without abnormal findings: Secondary | ICD-10-CM | POA: Diagnosis not present

## 2020-11-27 DIAGNOSIS — D649 Anemia, unspecified: Secondary | ICD-10-CM | POA: Diagnosis not present

## 2020-12-03 DIAGNOSIS — Z20822 Contact with and (suspected) exposure to covid-19: Secondary | ICD-10-CM | POA: Diagnosis not present

## 2021-05-01 DIAGNOSIS — M25561 Pain in right knee: Secondary | ICD-10-CM | POA: Diagnosis not present

## 2021-05-01 DIAGNOSIS — M1712 Unilateral primary osteoarthritis, left knee: Secondary | ICD-10-CM | POA: Diagnosis not present

## 2021-05-01 DIAGNOSIS — M25562 Pain in left knee: Secondary | ICD-10-CM | POA: Diagnosis not present

## 2021-05-01 DIAGNOSIS — M1711 Unilateral primary osteoarthritis, right knee: Secondary | ICD-10-CM | POA: Diagnosis not present

## 2021-05-29 DIAGNOSIS — E7800 Pure hypercholesterolemia, unspecified: Secondary | ICD-10-CM | POA: Diagnosis not present

## 2021-05-29 DIAGNOSIS — E559 Vitamin D deficiency, unspecified: Secondary | ICD-10-CM | POA: Diagnosis not present

## 2021-05-29 DIAGNOSIS — D649 Anemia, unspecified: Secondary | ICD-10-CM | POA: Diagnosis not present

## 2021-05-29 DIAGNOSIS — R7303 Prediabetes: Secondary | ICD-10-CM | POA: Diagnosis not present

## 2021-06-04 DIAGNOSIS — D649 Anemia, unspecified: Secondary | ICD-10-CM | POA: Diagnosis not present

## 2021-06-04 DIAGNOSIS — R7303 Prediabetes: Secondary | ICD-10-CM | POA: Diagnosis not present

## 2021-06-04 DIAGNOSIS — E559 Vitamin D deficiency, unspecified: Secondary | ICD-10-CM | POA: Diagnosis not present

## 2021-08-28 NOTE — Progress Notes (Signed)
Primary Physician/Referring:  Irena Reichmann, DO  Patient ID: Marie Swanson, female    DOB: [birth year], 60 y.o.   MRN: [id number]  Chief Complaint  Patient presents with   Shortness of Breath   New Patient (Initial Visit)   HPI:    Marie Swanson  is a 61 y.o. female with history of prediabetes.  She underwent bariatric surgery in 2015 followed by approximately 40 pound weight loss.  She was evaluated in our office by Dr. Jacinto Halim in 2018 for chest pain at which time stress test was low risk and patient was advised to follow-up as needed.  Denies history of hypertension, hyperlipidemia, MI, CVA/TIA, tobacco use.  Patient now presents to reestablish care with concerns of dyspnea on exertion and daytime fatigue over the last 6 months.  Patient reports that after bariatric surgery she lost approximately 40 pounds, however since then she has regained about 30 lbs. She has no formal exercise routine but does keep her house. She admits to snoring at night sometimes, but not daily. Denies PND or orthopnea.  Denies chest pain, palpitations, syncope, near syncope, dizziness.  Denies leg swelling.  History reviewed. No pertinent past medical history. Past Surgical History:  Procedure Laterality Date   BARIATRIC SURGERY  2014   CHOLECYSTECTOMY     Family History  Problem Relation Age of Onset   Heart attack Mother    Diabetes Father    Hypertension Father     Social History   Tobacco Use   Smoking status: Never   Smokeless tobacco: Never  Substance Use Topics   Alcohol use: No    Alcohol/week: 0.0 standard drinks   Marital Status: Married   ROS  Review of Systems  Constitutional: Positive for malaise/fatigue and weight gain.  Cardiovascular:  Positive for dyspnea on exertion. Negative for chest pain, claudication, leg swelling, near-syncope, orthopnea, palpitations, paroxysmal nocturnal dyspnea and syncope.  Neurological:  Negative for dizziness.   Objective  Blood pressure 128/76,  pulse 69, temperature 97.7 F (36.5 C), height 5\' 3"  (1.6 m), weight 198 lb (89.8 kg), SpO2 98 %.  Vitals with BMI 08/30/2021 07/02/2017 03/01/2015  Height 5\' 3"  5\' 3"  5\' 3"   Weight 198 lbs 180 lbs 3 oz 168 lbs 13 oz  BMI 35.08 32 30  Systolic 128 118 03/03/2015  Diastolic 76 72 68  Pulse 69 68 71      Physical Exam Vitals reviewed.  Constitutional:      Appearance: She is obese.  HENT:     Head: Normocephalic and atraumatic.  Cardiovascular:     Rate and Rhythm: Normal rate and regular rhythm.     Pulses: Intact distal pulses.     Heart sounds: S1 normal and S2 normal. No murmur heard.   No gallop.  Pulmonary:     Effort: Pulmonary effort is normal. No respiratory distress.     Breath sounds: No wheezing, rhonchi or rales.  Musculoskeletal:     Right lower leg: No edema.     Left lower leg: No edema.  Neurological:     Mental Status: She is alert.    Laboratory examination:   No results for input(s): NA, K, CL, CO2, GLUCOSE, BUN, CREATININE, CALCIUM, GFRNONAA, GFRAA in the last 8760 hours. CrCl cannot be calculated (Patient's most recent lab result is older than the maximum 21 days allowed.).  CMP Latest Ref Rng & Units 07/03/2017 02/27/2015  Glucose 65 - 99 mg/dL 99 97  BUN 7 - 25 mg/dL  12 18  Creatinine 0.50 - 1.05 mg/dL 8.67 6.72  Sodium 094 - 146 mmol/L 140 140  Potassium 3.5 - 5.3 mmol/L 4.3 4.4  Chloride 98 - 110 mmol/L 106 103  CO2 20 - 31 mmol/L 23 22  Calcium 8.6 - 10.4 mg/dL 9.2 9.4  Total Protein 6.1 - 8.1 g/dL 6.7 6.5  Total Bilirubin 0.2 - 1.2 mg/dL 0.7 0.7  Alkaline Phos 33 - 130 U/L 74 85  AST 10 - 35 U/L 19 18  ALT 6 - 29 U/L 15 17   CBC Latest Ref Rng & Units 08/07/2017 07/03/2017 02/27/2015  WBC 3.8 - 10.8 K/uL 3.4(L) 3.1(L) 5.6  Hemoglobin 11.7 - 15.5 g/dL 70.9 62.8 36.6  Hematocrit 35.0 - 45.0 % 38.7 37.5 37.5  Platelets 140 - 400 K/uL 220 214 261    Lipid Panel No results for input(s): CHOL, TRIG, LDLCALC, VLDL, HDL, CHOLHDL, LDLDIRECT in the last  8760 hours.  HEMOGLOBIN A1C No results found for: HGBA1C, MPG TSH No results for input(s): TSH in the last 8760 hours.  External labs:   05/29/2021: HDL 56, LDL 157, total cholesterol 123, triglycerides 86  04/28/2020: A1c 6.1%  Allergies   Allergies  Allergen Reactions   Tetanus Toxoids Swelling    Redness and swelling at the site of injection    Medications Prior to Visit:   Outpatient Medications Prior to Visit  Medication Sig Dispense Refill   acetaminophen (TYLENOL) 325 MG tablet Take 650 mg by mouth every 6 (six) hours as needed.     ibuprofen (ADVIL) 200 MG tablet Take 200 mg by mouth every 6 (six) hours as needed.     Multiple Vitamins-Minerals (MULTIVITAMIN) tablet Take 1 tablet by mouth daily.     Vitamin D, Ergocalciferol, (DRISDOL) 1.25 MG (50000 UNIT) CAPS capsule Take 50,000 Units by mouth once a week.     diclofenac (VOLTAREN) 75 MG EC tablet Take 1 tablet (75 mg total) by mouth 2 (two) times daily. 30 tablet 2   methocarbamol (ROBAXIN) 500 MG tablet Take 1 tablet (500 mg total) by mouth every 6 (six) hours as needed for muscle spasms. 30 tablet 2   predniSONE (STERAPRED UNI-PAK 21 TAB) 10 MG (21) TBPK tablet Take as directed 21 tablet 0   No facility-administered medications prior to visit.   Final Medications at End of Visit    Current Meds  Medication Sig   acetaminophen (TYLENOL) 325 MG tablet Take 650 mg by mouth every 6 (six) hours as needed.   atorvastatin (LIPITOR) 20 MG tablet Take 1 tablet (20 mg total) by mouth daily.   ibuprofen (ADVIL) 200 MG tablet Take 200 mg by mouth every 6 (six) hours as needed.   Multiple Vitamins-Minerals (MULTIVITAMIN) tablet Take 1 tablet by mouth daily.   Vitamin D, Ergocalciferol, (DRISDOL) 1.25 MG (50000 UNIT) CAPS capsule Take 50,000 Units by mouth once a week.   Radiology:   No results found.  Cardiac Studies:   Treadmill exercise stress test 01/13/2017: Resting EKG demonstrates normal sinus rhythm.  Patient  exercised according to Bruce protocol, total time recorded 7: 51 minutes achieving maximum heart rate of 164 which was 100% of target heart rate for age and 9.9 METS of work.  Normal BP response.  There was no ST-T changes of ischemia with stress exercise.  There were no significant arrhythmias.  Stress terminated due to target heart rate reached.  Normal treadmill stress test.  EKG:   08/30/2021: Sinus rhythm at a rate of  69 bpm.  Left atrial enlargement.  Normal axis.  Poor R wave progression, cannot exclude anteroseptal infarct old.  Nonspecific T wave abnormality.  01/06/2017: Normal sinus rhythm at a rate of 65 bpm.  Normal axis.  Poor R wave progression, cannot exclude anteroseptal infarct old.  No evidence of ischemia or underlying injury pattern.  Assessment     ICD-10-CM   1. Shortness of breath  R06.02 EKG 12-Lead    2. Dyspnea on exertion  R06.00 PCV ECHOCARDIOGRAM COMPLETE    CT CARDIAC SCORING ([facility] LOCATIONS ONLY)    3. Other fatigue  R53.83 PCV ECHOCARDIOGRAM COMPLETE    CT CARDIAC SCORING ([facility] LOCATIONS ONLY)    4. Hypercholesterolemia  E78.00 atorvastatin (LIPITOR) 20 MG tablet       Medications Discontinued During This Encounter  Medication Reason   diclofenac (VOLTAREN) 75 MG EC tablet Error   methocarbamol (ROBAXIN) 500 MG tablet Error   predniSONE (STERAPRED UNI-PAK 21 TAB) 10 MG (21) TBPK tablet Error    Meds ordered this encounter  Medications   atorvastatin (LIPITOR) 20 MG tablet    Sig: Take 1 tablet (20 mg total) by mouth daily.    Dispense:  90 tablet    Refill:  3    Recommendations:   Marie Swanson is a 60 y.o. female with history of prediabetes.  She underwent bariatric surgery in 2015 followed by approximately 40 pound weight loss.  She was evaluated in our office by Dr. Jacinto Halim in 2018 for chest pain at which time stress test was low risk and patient was advised to follow-up as needed.  Denies history of hypertension, hyperlipidemia, MI, CVA/TIA,  tobacco use.  Patient now presents to reestablish care with concerns of dyspnea on exertion and daytime fatigue over the last 6 months.  Suspect patient's symptoms are related to weight gain and deconditioning.  Encourage patient to increase physical activity and make diet and lifestyle modifications to focus on weight loss.  However given symptoms of fatigue and dyspnea on exertion will obtain echocardiogram to evaluate for underlying cardiac cause.  We will also proceed with coronary calcium score to further risk stratify patient.  I personally reviewed external labs, lipids uncontrolled.  Recommend initiation of atorvastatin 20 mg daily.  We will plan to repeat lipid profile testing in approximately 6 months.   Notably patient is leaving for an extended trip to [place] in 2 weeks.  She will be in [place] for approximately 4 months.  We will do our best to complete cardiac testing prior to her departure, however advised patient regarding signs and symptoms that would warrant more urgent evaluation with her provider in [place].  Otherwise we will plan to follow-up with patient when she returns.  Follow-up in 6 months, sooner if needed, for hyperlipidemia, fatigue, results of cardiac testing.   Rayford Halsted, PA-C 08/30/2021, 11:22 AM Office: [phone]

## 2021-08-30 ENCOUNTER — Ambulatory Visit: Payer: BC Managed Care – PPO | Admitting: Student

## 2021-08-30 ENCOUNTER — Other Ambulatory Visit: Payer: Self-pay

## 2021-08-30 ENCOUNTER — Encounter: Payer: Self-pay | Admitting: Student

## 2021-08-30 VITALS — BP 128/76 | HR 69 | Temp 97.7°F | Ht 63.0 in | Wt 198.0 lb

## 2021-08-30 DIAGNOSIS — R0609 Other forms of dyspnea: Secondary | ICD-10-CM

## 2021-08-30 DIAGNOSIS — R5383 Other fatigue: Secondary | ICD-10-CM | POA: Diagnosis not present

## 2021-08-30 DIAGNOSIS — R0600 Dyspnea, unspecified: Secondary | ICD-10-CM

## 2021-08-30 DIAGNOSIS — D649 Anemia, unspecified: Secondary | ICD-10-CM | POA: Diagnosis not present

## 2021-08-30 DIAGNOSIS — Z9884 Bariatric surgery status: Secondary | ICD-10-CM | POA: Diagnosis not present

## 2021-08-30 DIAGNOSIS — E538 Deficiency of other specified B group vitamins: Secondary | ICD-10-CM | POA: Diagnosis not present

## 2021-08-30 DIAGNOSIS — E559 Vitamin D deficiency, unspecified: Secondary | ICD-10-CM | POA: Diagnosis not present

## 2021-08-30 DIAGNOSIS — E7800 Pure hypercholesterolemia, unspecified: Secondary | ICD-10-CM | POA: Diagnosis not present

## 2021-08-30 DIAGNOSIS — R0602 Shortness of breath: Secondary | ICD-10-CM | POA: Diagnosis not present

## 2021-08-30 MED ORDER — ATORVASTATIN CALCIUM 20 MG PO TABS: 20.0000 mg | ORAL_TABLET | Freq: Every day | ORAL | 3 refills | Status: DC

## 2021-08-31 ENCOUNTER — Ambulatory Visit: Payer: BC Managed Care – PPO

## 2021-08-31 DIAGNOSIS — R0609 Other forms of dyspnea: Secondary | ICD-10-CM | POA: Diagnosis not present

## 2021-08-31 DIAGNOSIS — R0600 Dyspnea, unspecified: Secondary | ICD-10-CM

## 2021-08-31 DIAGNOSIS — R5383 Other fatigue: Secondary | ICD-10-CM

## 2021-09-06 DIAGNOSIS — R7303 Prediabetes: Secondary | ICD-10-CM | POA: Diagnosis not present

## 2021-09-06 DIAGNOSIS — M7660 Achilles tendinitis, unspecified leg: Secondary | ICD-10-CM | POA: Diagnosis not present

## 2021-09-06 DIAGNOSIS — Z862 Personal history of diseases of the blood and blood-forming organs and certain disorders involving the immune mechanism: Secondary | ICD-10-CM | POA: Diagnosis not present

## 2021-09-06 DIAGNOSIS — Z23 Encounter for immunization: Secondary | ICD-10-CM | POA: Diagnosis not present

## 2021-09-06 DIAGNOSIS — E559 Vitamin D deficiency, unspecified: Secondary | ICD-10-CM | POA: Diagnosis not present

## 2022-02-27 ENCOUNTER — Ambulatory Visit: Payer: BC Managed Care – PPO | Admitting: Student

## 2022-03-25 DIAGNOSIS — Z79899 Other long term (current) drug therapy: Secondary | ICD-10-CM | POA: Diagnosis not present

## 2022-03-25 DIAGNOSIS — R7303 Prediabetes: Secondary | ICD-10-CM | POA: Diagnosis not present

## 2022-03-25 DIAGNOSIS — E7800 Pure hypercholesterolemia, unspecified: Secondary | ICD-10-CM | POA: Diagnosis not present

## 2022-03-25 DIAGNOSIS — Z9884 Bariatric surgery status: Secondary | ICD-10-CM | POA: Diagnosis not present

## 2022-03-25 DIAGNOSIS — E538 Deficiency of other specified B group vitamins: Secondary | ICD-10-CM | POA: Diagnosis not present

## 2022-03-25 DIAGNOSIS — R7989 Other specified abnormal findings of blood chemistry: Secondary | ICD-10-CM | POA: Diagnosis not present

## 2022-03-25 DIAGNOSIS — E559 Vitamin D deficiency, unspecified: Secondary | ICD-10-CM | POA: Diagnosis not present

## 2022-03-25 DIAGNOSIS — Z862 Personal history of diseases of the blood and blood-forming organs and certain disorders involving the immune mechanism: Secondary | ICD-10-CM | POA: Diagnosis not present

## 2022-03-27 ENCOUNTER — Ambulatory Visit: Payer: BC Managed Care – PPO | Admitting: Student

## 2022-04-01 DIAGNOSIS — Z87898 Personal history of other specified conditions: Secondary | ICD-10-CM | POA: Diagnosis not present

## 2022-04-01 DIAGNOSIS — Z1211 Encounter for screening for malignant neoplasm of colon: Secondary | ICD-10-CM | POA: Diagnosis not present

## 2022-04-01 DIAGNOSIS — Z0000 Encounter for general adult medical examination without abnormal findings: Secondary | ICD-10-CM | POA: Diagnosis not present

## 2022-04-01 DIAGNOSIS — Z862 Personal history of diseases of the blood and blood-forming organs and certain disorders involving the immune mechanism: Secondary | ICD-10-CM | POA: Diagnosis not present

## 2022-07-31 DIAGNOSIS — H40003 Preglaucoma, unspecified, bilateral: Secondary | ICD-10-CM | POA: Diagnosis not present

## 2022-08-07 DIAGNOSIS — Z01818 Encounter for other preprocedural examination: Secondary | ICD-10-CM | POA: Diagnosis not present

## 2022-09-24 DIAGNOSIS — Z87898 Personal history of other specified conditions: Secondary | ICD-10-CM | POA: Diagnosis not present

## 2022-09-24 DIAGNOSIS — E559 Vitamin D deficiency, unspecified: Secondary | ICD-10-CM | POA: Diagnosis not present

## 2022-09-24 DIAGNOSIS — Z862 Personal history of diseases of the blood and blood-forming organs and certain disorders involving the immune mechanism: Secondary | ICD-10-CM | POA: Diagnosis not present

## 2022-10-02 DIAGNOSIS — E559 Vitamin D deficiency, unspecified: Secondary | ICD-10-CM | POA: Diagnosis not present

## 2022-10-02 DIAGNOSIS — H40003 Preglaucoma, unspecified, bilateral: Secondary | ICD-10-CM | POA: Diagnosis not present

## 2022-10-02 DIAGNOSIS — Z23 Encounter for immunization: Secondary | ICD-10-CM | POA: Diagnosis not present

## 2022-10-02 DIAGNOSIS — M5030 Other cervical disc degeneration, unspecified cervical region: Secondary | ICD-10-CM | POA: Diagnosis not present

## 2022-10-02 DIAGNOSIS — M19012 Primary osteoarthritis, left shoulder: Secondary | ICD-10-CM | POA: Diagnosis not present

## 2022-10-02 DIAGNOSIS — Z87898 Personal history of other specified conditions: Secondary | ICD-10-CM | POA: Diagnosis not present

## 2024-03-31 DIAGNOSIS — R079 Chest pain, unspecified: Secondary | ICD-10-CM | POA: Diagnosis not present

## 2024-06-09 DIAGNOSIS — R7303 Prediabetes: Secondary | ICD-10-CM | POA: Diagnosis not present

## 2024-06-09 DIAGNOSIS — Z79899 Other long term (current) drug therapy: Secondary | ICD-10-CM | POA: Diagnosis not present

## 2024-06-09 DIAGNOSIS — E559 Vitamin D deficiency, unspecified: Secondary | ICD-10-CM | POA: Diagnosis not present

## 2024-06-09 DIAGNOSIS — Z9884 Bariatric surgery status: Secondary | ICD-10-CM | POA: Diagnosis not present

## 2024-06-09 DIAGNOSIS — E7800 Pure hypercholesterolemia, unspecified: Secondary | ICD-10-CM | POA: Diagnosis not present

## 2024-06-09 DIAGNOSIS — R7309 Other abnormal glucose: Secondary | ICD-10-CM | POA: Diagnosis not present

## 2024-06-14 DIAGNOSIS — R7303 Prediabetes: Secondary | ICD-10-CM | POA: Diagnosis not present

## 2024-06-14 DIAGNOSIS — Z0000 Encounter for general adult medical examination without abnormal findings: Secondary | ICD-10-CM | POA: Diagnosis not present

## 2024-06-14 DIAGNOSIS — L989 Disorder of the skin and subcutaneous tissue, unspecified: Secondary | ICD-10-CM | POA: Diagnosis not present

## 2024-06-14 DIAGNOSIS — Z23 Encounter for immunization: Secondary | ICD-10-CM | POA: Diagnosis not present

## 2024-07-01 DIAGNOSIS — R92333 Mammographic heterogeneous density, bilateral breasts: Secondary | ICD-10-CM | POA: Diagnosis not present

## 2024-07-01 DIAGNOSIS — Z1231 Encounter for screening mammogram for malignant neoplasm of breast: Secondary | ICD-10-CM | POA: Diagnosis not present

## 2024-07-26 ENCOUNTER — Encounter: Payer: Self-pay | Admitting: *Deleted

## 2024-07-29 ENCOUNTER — Ambulatory Visit: Attending: Internal Medicine | Admitting: Internal Medicine

## 2024-07-29 ENCOUNTER — Encounter: Payer: Self-pay | Admitting: Internal Medicine

## 2024-07-29 ENCOUNTER — Other Ambulatory Visit (HOSPITAL_COMMUNITY): Payer: Self-pay

## 2024-07-29 VITALS — BP 122/70 | HR 80 | Ht 64.0 in | Wt 202.0 lb

## 2024-07-29 DIAGNOSIS — R0609 Other forms of dyspnea: Secondary | ICD-10-CM

## 2024-07-29 DIAGNOSIS — R079 Chest pain, unspecified: Secondary | ICD-10-CM

## 2024-07-29 DIAGNOSIS — Q245 Malformation of coronary vessels: Secondary | ICD-10-CM

## 2024-07-29 DIAGNOSIS — R7303 Prediabetes: Secondary | ICD-10-CM

## 2024-07-29 DIAGNOSIS — E7800 Pure hypercholesterolemia, unspecified: Secondary | ICD-10-CM | POA: Diagnosis not present

## 2024-07-29 DIAGNOSIS — I2510 Atherosclerotic heart disease of native coronary artery without angina pectoris: Secondary | ICD-10-CM

## 2024-07-29 MED ORDER — ATORVASTATIN CALCIUM 20 MG PO TABS
20.0000 mg | ORAL_TABLET | Freq: Every day | ORAL | 3 refills | Status: AC
  Filled 2024-07-29: qty 90, 90d supply, fill #0

## 2024-07-29 NOTE — Progress Notes (Signed)
 Cardiology Office Note:  .   Date:  07/29/2024  ID:  Signe VEAR Blanch, DOB 03-22-1961, MRN [id number] PCP: Gerome Brunet, DO  [facility] Providers Cardiologist:  Soyla DELENA Merck, MD    History of Present Illness: .   Marie Swanson is a 63 y.o. female.  Discussed the use of AI scribe software for clinical note transcription with the patient, who gave verbal consent to proceed.  History of Present Illness Marie Swanson is a 63 year old female who presents with chest pain on exertion. She was referred for evaluation of chest pain on exertion.  She experiences chest pain on exertion, particularly when walking uphill or climbing stairs, for the past couple of months. The discomfort is described as shortness of breath rather than pain, resolving after resting for about two minutes. No chest pain or pressure at rest. No episodes of palpitations or irregular heartbeats.  She denies having diabetes but acknowledges prediabetes with a hemoglobin A1c of 6.2. She has not been taking her prescribed atorvastatin  20 mg regularly due to forgetfulness. Her medication regimen includes iron supplementation.  While visiting [place] earlier this year, she underwent a coronary CT scan which revealed a calcium  score of 50, left dominant circulation, myocardial bridging of the mid LAD, and 30% narrowing of the proximal left circumflex artery. An EKG performed in [place] was reportedly normal. She also had a normal echocardiogram at [facility] in 2022 and a normal treadmill exercise stress test in 2018.  Family history is significant for her mother, who had a stroke at the age of 71 and had high blood pressure and high cholesterol. There is no family history of heart attacks, diabetes, or other heart diseases.    ROS: negative except per HPI above.  Studies Reviewed: SABRA   EKG Interpretation Date/Time:  Thursday July 29 2024 15:41:27 EDT Ventricular Rate:  80 PR Interval:  144 QRS  Duration:  82 QT Interval:  354 QTC Calculation: 408 R Axis:   38  Text Interpretation: Normal sinus rhythm Low voltage QRS Confirmed by Merck Soyla ([id number]) on 08/02/2024 6:09:26 AM    Results LABS HbA1c: 6.2  RADIOLOGY Calcium  Score: 50 (01/2024) Coronary CT: Left dominant circulation, myocardial bridging of the mid LAD, 30% narrowing of the proximal left circumflex artery (01/2024) Carotid Doppler: Normal (01/2024)  DIAGNOSTIC EKG: Normal (01/2024) Risk Assessment/Calculations:       Physical Exam:   VS:  BP 122/70   Pulse 80   Ht 5' 4 (1.626 m)   Wt 202 lb (91.6 kg)   SpO2 98%   BMI 34.67 kg/m    Wt Readings from Last 3 Encounters:  07/29/24 202 lb (91.6 kg)  08/30/21 198 lb (89.8 kg)  07/02/17 180 lb 3.2 oz (81.7 kg)     Physical Exam GENERAL: Alert, cooperative, well developed, no acute distress HEENT: Normocephalic, normal oropharynx, moist mucous membranes CHEST: Clear to auscultation bilaterally, no wheezes, rhonchi, or crackles CARDIOVASCULAR: Normal heart rate and rhythm, S1 and S2 normal without murmurs ABDOMEN: Soft, non-tender, non-distended, without organomegaly, normal bowel sounds EXTREMITIES: No cyanosis or edema NEUROLOGICAL: Cranial nerves grossly intact, moves all extremities without gross motor or sensory deficit   ASSESSMENT AND PLAN: .    Assessment and Plan Assessment & Plan Exertional dyspnea  Exertional dyspnea occurs primarily when walking uphill or climbing stairs, with recovery in about two minutes. Normal EKG. Coronary CT scan shows 30% narrowing of the proximal left circumflex artery, but this is not  believed to be the cause of dyspnea. Differential includes deconditioning, but she denies feeling out of shape. - Order echocardiogram to evaluate for potential cardiac causes of dyspnea. - Encourage regular exercise to improve conditioning.  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 Coronary CT scan shows 30% stenosis in the left circumflex artery. Discussed the importance of managing cholesterol to prevent progression of atherosclerosis and potential cardiovascular events. Atorvastatin  20 mg is considered a suitable dose, with potential to increase if needed. Discussed alternative treatments if atorvastatin  is not tolerated, including injectable medications. Emphasized the role of atorvastatin  in stabilizing plaque and preventing future cardiovascular events. - Prescribe atorvastatin  20 mg daily. - Recheck cholesterol levels in 3 months. - Consider alternative lipid-lowering therapies if atorvastatin  is not tolerated.  Prediabetes Hemoglobin A1c is 6.2, indicating prediabetes.  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 No current symptoms of chest pain or pressure related to this finding. Explained that myocardial bridging is generally benign and does not require intervention unless symptomatic.  Generalized body pain Reports generalized body pain, not associated with atorvastatin  use. Uses Tylenol and aspirin for pain management. Discussed the use of Tylenol and occasional ibuprofen for pain relief, with caution regarding ibuprofen's effects on the stomach and kidneys. - Continue current pain management with Tylenol and occasional ibuprofen as needed.      Soyla Merck, MD, FACC

## 2024-07-29 NOTE — Patient Instructions (Signed)
 Medication Instructions:  Start: Atorvastatin  (Lipitor) 20 mg one tablet, once daily at bedtime  Lab Work: None  Testing/Procedures: Your physician has requested that you have an echocardiogram. Echocardiography is a painless test that uses sound waves to create images of your heart. It provides your doctor with information about the size and shape of your heart and how well your heart's chambers and valves are working. This procedure takes approximately one hour. There are no restrictions for this procedure. Please do NOT wear cologne, perfume, aftershave, or lotions (deodorant is allowed). Please arrive 15 minutes prior to your appointment time.  Please note: We ask at that you not bring children with you during ultrasound (echo/ vascular) testing. Due to room size and safety concerns, children are not allowed in the ultrasound rooms during exams. Our front office staff cannot provide observation of children in our lobby area while testing is being conducted. An adult accompanying a patient to their appointment will only be allowed in the ultrasound room at the discretion of the ultrasound technician under special circumstances. We apologize for any inconvenience.   Follow-Up: At [facility], you and your health needs are our priority.  As part of our continuing mission to provide you with exceptional heart care, our providers are all part of one team.  This team includes your primary Cardiologist (physician) and Advanced Practice Providers or APPs (Physician Assistants and Nurse Practitioners) who all work together to provide you with the care you need, when you need it.  Your next appointment:   1 year(s)  Provider:   Soyla DELENA Merck, MD   We recommend signing up for the patient portal called MyChart.  Sign up information is provided on this After Visit Summary.  MyChart is used to connect with patients for Virtual Visits (Telemedicine).  Patients are able to view lab/test  results, encounter notes, upcoming appointments, etc.  Non-urgent messages can be sent to your provider as well.   To learn more about what you can do with MyChart, go to [email or web address].   Other Instructions Please call us  or send a MyChart message with any Cardiology related questions/concerns.  [phone].  Thank you!

## 2024-09-03 ENCOUNTER — Telehealth (HOSPITAL_COMMUNITY): Payer: Self-pay | Admitting: Internal Medicine

## 2024-09-03 NOTE — Telephone Encounter (Signed)
 09/03/24 PT CANCELLED via automated reminder system Auto Confirm Status: TEXT NO. Order will be removed from the active echo WQ and if patient calls back to reschedule we will reinstate the order. Thank you.

## 2024-09-06 ENCOUNTER — Ambulatory Visit (HOSPITAL_COMMUNITY)

## 2024-10-11 ENCOUNTER — Other Ambulatory Visit (HOSPITAL_COMMUNITY)

## 2024-10-12 ENCOUNTER — Ambulatory Visit (HOSPITAL_COMMUNITY)
Admission: RE | Admit: 2024-10-12 | Discharge: 2024-10-12 | Disposition: A | Discharge status: Home / Self Care | Source: Ambulatory Visit | Attending: Cardiology | Admitting: Cardiology

## 2024-10-12 DIAGNOSIS — R0609 Other forms of dyspnea: Secondary | ICD-10-CM | POA: Diagnosis not present

## 2024-10-12 LAB — ECHOCARDIOGRAM COMPLETE
Area-P 1/2: 4.17 cm2
S' Lateral: 2.1 cm

## 2024-10-13 ENCOUNTER — Ambulatory Visit (HOSPITAL_COMMUNITY)

## 2024-10-16 ENCOUNTER — Ambulatory Visit: Payer: Self-pay | Admitting: Internal Medicine
